# Patient Record
Sex: Male | Born: 1960 | Race: White | Hispanic: No | Marital: Married | Smoking: Never smoker
Health system: Southern US, Community
[De-identification: ages and names within clinical notes are randomized; demographics above are authoritative.]

## PROBLEM LIST (undated history)

## (undated) DIAGNOSIS — N200 Calculus of kidney: Secondary | ICD-10-CM

## (undated) DIAGNOSIS — F329 Major depressive disorder, single episode, unspecified: Secondary | ICD-10-CM

## (undated) DIAGNOSIS — I341 Nonrheumatic mitral (valve) prolapse: Secondary | ICD-10-CM

## (undated) DIAGNOSIS — F32A Depression, unspecified: Secondary | ICD-10-CM

## (undated) DIAGNOSIS — T7840XA Allergy, unspecified, initial encounter: Secondary | ICD-10-CM

## (undated) HISTORY — DX: Allergy, unspecified, initial encounter: T78.40XA

## (undated) HISTORY — DX: Calculus of kidney: N20.0

## (undated) HISTORY — DX: Depression, unspecified: F32.A

## (undated) HISTORY — DX: Nonrheumatic mitral (valve) prolapse: I34.1

## (undated) HISTORY — DX: Other disorders of bilirubin metabolism: E80.6

## (undated) HISTORY — DX: Major depressive disorder, single episode, unspecified: F32.9

## (undated) HISTORY — PX: INTUSSUSCEPTION REPAIR: SHX1847

---

## 1994-06-01 HISTORY — PX: ARTHROSCOPIC REPAIR ACL: SUR80

## 1999-07-30 ENCOUNTER — Ambulatory Visit (HOSPITAL_COMMUNITY): Admission: RE | Admit: 1999-07-30 | Discharge: 1999-07-30 | Payer: Self-pay | Admitting: Gastroenterology

## 1999-07-30 ENCOUNTER — Encounter: Payer: Self-pay | Admitting: Gastroenterology

## 1999-08-19 ENCOUNTER — Ambulatory Visit (HOSPITAL_COMMUNITY): Admission: RE | Admit: 1999-08-19 | Discharge: 1999-08-19 | Payer: Self-pay | Admitting: Gastroenterology

## 1999-08-19 ENCOUNTER — Encounter: Payer: Self-pay | Admitting: Gastroenterology

## 1999-11-07 ENCOUNTER — Encounter: Admission: RE | Admit: 1999-11-07 | Discharge: 1999-11-07 | Payer: Self-pay | Admitting: Family Medicine

## 2003-06-02 HISTORY — PX: COLONOSCOPY: SHX174

## 2003-09-28 ENCOUNTER — Ambulatory Visit (HOSPITAL_COMMUNITY): Admission: RE | Admit: 2003-09-28 | Discharge: 2003-09-28 | Payer: Self-pay | Admitting: Gastroenterology

## 2005-11-30 ENCOUNTER — Ambulatory Visit: Payer: Self-pay | Admitting: Family Medicine

## 2006-08-30 ENCOUNTER — Ambulatory Visit: Payer: Self-pay | Admitting: Family Medicine

## 2006-12-06 ENCOUNTER — Ambulatory Visit: Payer: Self-pay | Admitting: Family Medicine

## 2007-01-06 ENCOUNTER — Ambulatory Visit: Payer: Self-pay | Admitting: Family Medicine

## 2007-08-07 ENCOUNTER — Ambulatory Visit: Payer: Self-pay | Admitting: Family Medicine

## 2007-08-26 ENCOUNTER — Ambulatory Visit: Payer: Self-pay | Admitting: Internal Medicine

## 2007-10-03 ENCOUNTER — Ambulatory Visit: Payer: Self-pay | Admitting: Family Medicine

## 2008-01-17 ENCOUNTER — Ambulatory Visit: Payer: Self-pay | Admitting: Family Medicine

## 2008-09-25 ENCOUNTER — Ambulatory Visit: Payer: Self-pay | Admitting: Family Medicine

## 2008-10-04 ENCOUNTER — Ambulatory Visit: Payer: Self-pay | Admitting: Family Medicine

## 2009-05-29 ENCOUNTER — Ambulatory Visit: Payer: Self-pay | Admitting: Family Medicine

## 2009-06-24 ENCOUNTER — Ambulatory Visit: Payer: Self-pay | Admitting: Family Medicine

## 2009-07-25 ENCOUNTER — Ambulatory Visit: Payer: Self-pay | Admitting: Family Medicine

## 2009-08-12 ENCOUNTER — Ambulatory Visit: Payer: Self-pay | Admitting: Family Medicine

## 2009-08-19 ENCOUNTER — Ambulatory Visit: Payer: Self-pay | Admitting: Family Medicine

## 2009-08-29 ENCOUNTER — Ambulatory Visit: Payer: Self-pay | Admitting: Family Medicine

## 2010-09-24 ENCOUNTER — Encounter (INDEPENDENT_AMBULATORY_CARE_PROVIDER_SITE_OTHER): Payer: 59 | Admitting: Family Medicine

## 2010-09-24 DIAGNOSIS — G479 Sleep disorder, unspecified: Secondary | ICD-10-CM

## 2010-09-24 DIAGNOSIS — J309 Allergic rhinitis, unspecified: Secondary | ICD-10-CM

## 2010-09-24 DIAGNOSIS — Z0000 Encounter for general adult medical examination without abnormal findings: Secondary | ICD-10-CM

## 2010-09-24 DIAGNOSIS — Z79899 Other long term (current) drug therapy: Secondary | ICD-10-CM

## 2010-10-28 ENCOUNTER — Encounter: Payer: Self-pay | Admitting: Medical

## 2010-10-28 ENCOUNTER — Ambulatory Visit (INDEPENDENT_AMBULATORY_CARE_PROVIDER_SITE_OTHER): Payer: 59 | Admitting: Medical

## 2010-10-28 VITALS — BP 110/78 | HR 80 | Temp 98.2°F | Ht 70.0 in | Wt 148.0 lb

## 2010-10-28 DIAGNOSIS — J029 Acute pharyngitis, unspecified: Secondary | ICD-10-CM

## 2010-10-28 LAB — POCT RAPID STREP A (OFFICE): Rapid Strep A Screen: NEGATIVE

## 2010-10-28 NOTE — Progress Notes (Signed)
  Subjective:     Cory [name] is a 50 y.o. male who presents for evaluation of sore throat. Associated symptoms include sweats, low grade fevers, sinus and nasal congestion and sore throat. Onset of symptoms was 4 days ago, and have been unchanged since that time. He is drinking plenty of fluids. Sick contacts include his wife who has been sick for the last 10 days with the flu, she was not on any type of medication. He is using Tylenol for symptoms. No other aggravating or relieving factors. 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  Past Medical History  Diagnosis Date  . MVP (mitral valve prolapse)   . Hyperbilirubinemia   . Renal stone   . Allergy      Review of Systems Constitutional: denies unexpected weight change, anorexia, fatigue Dermatology: denies rash Cardiology: denies chest pain, palpitations Respiratory: Positive for cough the last 2 days, nonproductive. denies shortness of breath, wheezing,  Gastroenterology: Positive for diarrhea, but he notes long history of diarrhea problems. denies abdominal pain, nausea, vomiting Musculoskeletal: denies arthralgias, myalgias, joint swelling, back pain, neck pain Ophthalmology: denies eye redness, itching, discharge    Objective:      Filed Vitals:   10/28/10 0949  BP: 110/78  Pulse: 80  Temp: 98.2 F (36.8 C)    General appearance: no distress, WD/WN, mildly ill-appearing HEENT: normocephalic, conjunctiva/corneas normal, sclerae anicteric, nares patent, clear discharge, mild erythema, pharynx with erythema, no exudate.  Oral cavity: MMM, no lesions  Neck: supple, no lymphadenopathy, no thyromegaly Heart: RRR, normal S1, S2, no murmurs Lungs: CTA bilaterally, no wheezes, rhonchi, or rales Abdomen: +bs, soft, non tender, non distended, no masses, no hepatomegaly, no splenomegaly Musculoskeletal: non tender, no obvious deformity of  extremities  Laboratory Strep test done. Results:negative.    Assessment:    Acute pharyngitis, likely  Viral pharyngitis.     Plan:   Advised that symptoms and exam suggest a viral etiology.  Discussed symptomatic treatment including salt water gargles, warm fluids, rest, hydrate well, can use over-the-counter Tylenol for throat pain, fever, or malaise. If worse or not improving within 2-3 days, call or return.

## 2011-01-19 ENCOUNTER — Encounter: Payer: Self-pay | Admitting: Family Medicine

## 2011-01-19 ENCOUNTER — Ambulatory Visit (INDEPENDENT_AMBULATORY_CARE_PROVIDER_SITE_OTHER): Payer: 59 | Admitting: Family Medicine

## 2011-01-19 VITALS — BP 110/80 | HR 70 | Wt 145.0 lb

## 2011-01-19 DIAGNOSIS — M549 Dorsalgia, unspecified: Secondary | ICD-10-CM

## 2011-01-19 NOTE — Patient Instructions (Signed)
Heat for 20 minutes 3 times per day. Gentle stretching. Aleve 2 pills twice a day. Massage and chiropractic can both be beneficial

## 2011-01-19 NOTE — Progress Notes (Signed)
  Subjective:    Patient ID: Cory [name], male    DOB: [birth year], 50 y.o.   MRN: [id number]  HPI Approximately 4 weeks ago he went to a trampoline part and developed some pain but has no knowledge of any acute injury. He treated this conservatively but has not seen any improvement. It is interfering with his ability to run. He is trying to train for  Review of Systems     Objective:   Physical Exam Good motion of his back. No tenderness to palpation of the paravertebral muscles or the spine. Lungs clear to auscultation.       Assessment & Plan:  Probable musculoskeletal back pain. I discussed options with him including heat, Aleve, gentle stretching as well as massage and chiropractic. If he does not improve he will return for further evaluation. Also discussed need for him to get back into more of a physical activity as he gets better but due to slowly

## 2011-11-19 ENCOUNTER — Telehealth: Payer: Self-pay | Admitting: Internal Medicine

## 2011-11-19 NOTE — Telephone Encounter (Signed)
error 

## 2011-11-24 ENCOUNTER — Encounter: Payer: Self-pay | Admitting: Internal Medicine

## 2011-11-26 ENCOUNTER — Telehealth: Payer: Self-pay | Admitting: Family Medicine

## 2011-11-26 NOTE — Telephone Encounter (Signed)
LM

## 2011-11-30 ENCOUNTER — Ambulatory Visit (INDEPENDENT_AMBULATORY_CARE_PROVIDER_SITE_OTHER): Payer: 59 | Admitting: Family Medicine

## 2011-11-30 ENCOUNTER — Encounter: Payer: Self-pay | Admitting: Family Medicine

## 2011-11-30 VITALS — BP 120/80 | HR 73 | Ht 70.0 in | Wt 152.0 lb

## 2011-11-30 DIAGNOSIS — J302 Other seasonal allergic rhinitis: Secondary | ICD-10-CM

## 2011-11-30 DIAGNOSIS — Z8249 Family history of ischemic heart disease and other diseases of the circulatory system: Secondary | ICD-10-CM

## 2011-11-30 DIAGNOSIS — Z0000 Encounter for general adult medical examination without abnormal findings: Secondary | ICD-10-CM

## 2011-11-30 DIAGNOSIS — Z87442 Personal history of urinary calculi: Secondary | ICD-10-CM | POA: Insufficient documentation

## 2011-11-30 DIAGNOSIS — F341 Dysthymic disorder: Secondary | ICD-10-CM

## 2011-11-30 DIAGNOSIS — N200 Calculus of kidney: Secondary | ICD-10-CM

## 2011-11-30 DIAGNOSIS — J309 Allergic rhinitis, unspecified: Secondary | ICD-10-CM

## 2011-11-30 LAB — POCT URINALYSIS DIPSTICK
Blood, UA: NEGATIVE
Protein, UA: NEGATIVE
Spec Grav, UA: 1.02
Urobilinogen, UA: NEGATIVE
pH, UA: 5

## 2011-11-30 MED ORDER — CITALOPRAM HYDROBROMIDE 20 MG PO TABS: 20.0000 mg | ORAL_TABLET | Freq: Every day | ORAL | Status: DC

## 2011-11-30 MED ORDER — BUPROPION HCL ER (SR) 150 MG PO TB12: 150.0000 mg | ORAL_TABLET | Freq: Two times a day (BID) | ORAL | Status: DC

## 2011-11-30 NOTE — Progress Notes (Signed)
Subjective:    Patient ID: Cory [name], male    DOB: [birth year], 51 y.o.   MRN: [id number]  HPI He is here for complete examination. In general he is doing well. He has had no difficulty from his allergies. He has had no recent renal stones. His GI system is not giving him any trouble. His main concern today is that although he is doing relatively well, psychologically he is not ready would like to be. He has tried stopping the Wellbutrin in the past but started back on it after a month or 2. He found that he became much more irritable and less productive at work when he was off his Wellbutrin. He has been involved in counseling. He is interested in trying another medication although in the past he had been reluctant to try this. He does have various other complaints that are chronic and stable. They're listed on his history form. His work is going fairly well. His home life is stable. Social and family history is unchanged.   Review of Systems Negative except as above    Objective:   Physical Exam BP 120/80  Pulse 73  Ht 5' 10"  (1.778 m)  Wt 152 lb (68.947 kg)  BMI 21.81 kg/m2  SpO2 98%  General Appearance:    Alert, cooperative, no distress, appears stated age  Head:    Normocephalic, without obvious abnormality, atraumatic  Eyes:    PERRL, conjunctiva/corneas clear, EOM's intact, fundi    benign  Ears:    Normal TM's and external ear canals  Nose:   Nares normal, mucosa normal, no drainage or sinus   tenderness  Throat:   Lips, mucosa, and tongue normal; teeth and gums normal  Neck:   Supple, no lymphadenopathy;  thyroid:  no   enlargement/tenderness/nodules; no carotid   bruit or JVD  Back:    Spine nontender, no curvature, ROM normal, no CVA     tenderness  Lungs:     Clear to auscultation bilaterally without wheezes, rales or     ronchi; respirations unlabored  Chest Wall:    No tenderness or deformity   Heart:    Regular rate and rhythm, S1 and S2 normal, no murmur, rub  or gallop  Breast Exam:    No chest wall tenderness, masses or gynecomastia  Abdomen:     Soft, non-tender, nondistended, normoactive bowel sounds,    no masses, no hepatosplenomegaly  Genitalia:    Normal male external genitalia without lesions.  Testicles without masses.  No inguinal hernias.  Rectal:    Normal sphincter tone, no masses or tenderness; guaiac positive stool.  Prostate smooth, no nodules, not enlarged.  Extremities:   No clubbing, cyanosis or edema  Pulses:   2+ and symmetric all extremities  Skin:   Skin color, texture, turgor normal, no rashes or lesions  Lymph nodes:   Cervical, supraclavicular, and axillary nodes normal  Neurologic:   CNII-XII intact, normal strength, sensation and gait; reflexes 2+ and symmetric throughout          Psych:   Normal mood, affect, hygiene and grooming.           Assessment & Plan:   1. Routine general medical examination at a health care facility  POCT Urinalysis Dipstick, Hemoccult - 1 Card (office)  2. Dysthymia  citalopram (CELEXA) 20 MG tablet, buPROPion (WELLBUTRIN SR) 150 MG 12 hr tablet  3. Gilbert's disease    4. Allergic rhinitis, seasonal    5. Renal  stones    6. Family history of ischemic heart disease     his stools have been guaiac positive ever since we have checked this. He did have a colonoscopy several years ago which was clean. He is not interested in pursuing this any further. I will add Celexa to his regimen. Check him again in 6 weeks. Hopefully this will help turn the corner psychologically for him.

## 2012-01-12 ENCOUNTER — Ambulatory Visit (INDEPENDENT_AMBULATORY_CARE_PROVIDER_SITE_OTHER): Payer: 59 | Admitting: Family Medicine

## 2012-01-12 ENCOUNTER — Ambulatory Visit
Admission: RE | Admit: 2012-01-12 | Discharge: 2012-01-12 | Disposition: A | Discharge status: Home / Self Care | Payer: 59 | Source: Ambulatory Visit | Attending: Family Medicine | Admitting: Family Medicine

## 2012-01-12 ENCOUNTER — Encounter: Payer: Self-pay | Admitting: Family Medicine

## 2012-01-12 VITALS — BP 120/76 | HR 89 | Wt 150.0 lb

## 2012-01-12 DIAGNOSIS — M25532 Pain in left wrist: Secondary | ICD-10-CM

## 2012-01-12 DIAGNOSIS — F341 Dysthymic disorder: Secondary | ICD-10-CM

## 2012-01-12 DIAGNOSIS — M25539 Pain in unspecified wrist: Secondary | ICD-10-CM

## 2012-01-12 MED ORDER — BUPROPION HCL ER (SR) 200 MG PO TB12: 200.0000 mg | ORAL_TABLET | Freq: Two times a day (BID) | ORAL | Status: DC

## 2012-01-12 NOTE — Patient Instructions (Signed)
Call me in about a month to let me know how the new dosing is working

## 2012-01-12 NOTE — Progress Notes (Signed)
  Subjective:    Patient ID: Cory [name], male    DOB: 11-22-1960, 51 y.o.   MRN: [id number]  HPI He is here for recheck. He does state that he doesn't feel any better on the Celexa and in fact is having sexual side effects of slightly decreased libido and anorgasmia. He also is noted some nausea. He fell while ice-skating on July 6 injuring his wrist. He continues to complain of pain in the carpal area. He also is having some difficulty with left foot pain near the MTP joint of the first toe. 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   Review of Systems     Objective:   Physical Exam Alert and in no distress with appropriate affect. Exam of the wrist does show good motion with some tenderness palpation over the midportion of the carpals. No swelling is noted. He was sent for x-ray and there is question of triquetrum fracture. Exam shows slight tenderness to palpation over the MTP joint. Tenderness intact.       Assessment & Plan:   1. Dysthymia  buPROPion (WELLBUTRIN SR) 200 MG 12 hr tablet  2. Left wrist pain  Ambulatory referral to Orthopedic Surgery, DG Wrist 2 Views Left   possible left wrist fracture. Refer to orthopedics for this. Recommend changing the mechanism that he is switching gears with his foot and also use padding in this area did take some pressure off. He is to call me in about a month to let me know how he is doing on the higher dose of Wellbutrin.

## 2012-02-09 ENCOUNTER — Ambulatory Visit (INDEPENDENT_AMBULATORY_CARE_PROVIDER_SITE_OTHER): Payer: 59 | Admitting: Family Medicine

## 2012-02-09 ENCOUNTER — Encounter: Payer: Self-pay | Admitting: Family Medicine

## 2012-02-09 VITALS — BP 112/80 | HR 76 | Temp 98.4°F | Resp 16 | Wt 151.0 lb

## 2012-02-09 DIAGNOSIS — M7020 Olecranon bursitis, unspecified elbow: Secondary | ICD-10-CM

## 2012-02-09 DIAGNOSIS — M7021 Olecranon bursitis, right elbow: Secondary | ICD-10-CM

## 2012-02-09 MED ORDER — DOXYCYCLINE HYCLATE 100 MG PO TABS: 100.0000 mg | ORAL_TABLET | Freq: Two times a day (BID) | ORAL | Status: AC

## 2012-02-09 NOTE — Progress Notes (Signed)
  Subjective:    Patient ID: Cory [name], male    DOB: December 15, 1960, 51 y.o.   MRN: [id number]  HPI In July he fell and injured his left wrist and right elbow. The wrist is being monitored at this time. Since the fall in July he noted one episode approximately 3 weeks ago when the right elbow swelled and was tender to palpation for a week or 2. It spontaneously resolved. Yesterday he noted the onset of swelling, warmth and slight tenderness.   Review of Systems     Objective:   Physical Exam Right olecranon bursa is slightly warm, red and tender to palpation. Roughly 5 x 6 cm. No evidence of disruption of the skin       Assessment & Plan:   1. Olecranon bursitis of right elbow  doxycycline (VIBRA-TABS) 100 MG tablet   I explained that I would treat this conservatively since it did  resolve on its own prior to this. If he continues to have difficulty, further surgical evaluation will be considered.

## 2012-02-09 NOTE — Patient Instructions (Signed)
If the swelling and redness gets worse, give me a call

## 2012-02-19 ENCOUNTER — Encounter: Payer: Self-pay | Admitting: Family Medicine

## 2012-02-19 ENCOUNTER — Ambulatory Visit (INDEPENDENT_AMBULATORY_CARE_PROVIDER_SITE_OTHER): Payer: 59 | Admitting: Family Medicine

## 2012-02-19 VITALS — BP 102/64 | HR 60 | Wt 151.0 lb

## 2012-02-19 DIAGNOSIS — M7020 Olecranon bursitis, unspecified elbow: Secondary | ICD-10-CM

## 2012-02-19 DIAGNOSIS — M7021 Olecranon bursitis, right elbow: Secondary | ICD-10-CM

## 2012-02-19 DIAGNOSIS — Z23 Encounter for immunization: Secondary | ICD-10-CM

## 2012-02-19 NOTE — Progress Notes (Signed)
Pt is scheduled with Dr. Bernardo Heater on Friday October 4th @ 2:15pm for bursitis in right elbow. [phone]

## 2012-02-19 NOTE — Progress Notes (Signed)
  Subjective:    Patient ID: Cory [name], male    DOB: Nov 21, 1960, 51 y.o.   MRN: [id number]  HPI He is here for recheck. The swelling has not diminished. He is having some minimal discomfort especially with palpation proximally.  Review of Systems     Objective:   Physical Exam Exam of the right elbow does show diffuse swelling but no tenderness. It is only slightly warmer than the other scan.       Assessment & Plan:   1. Olecranon bursitis of right elbow  Ambulatory referral to Orthopedic Surgery  discussed possible aspiration but feel more comfortable referring to orthopedics for followup on this since he does have a history of, to that area.

## 2012-10-20 ENCOUNTER — Telehealth: Payer: Self-pay | Admitting: Internal Medicine

## 2012-10-20 NOTE — Telephone Encounter (Signed)
Called pt to inform him that his script was ready

## 2012-10-20 NOTE — Telephone Encounter (Signed)
Pt has appt July 7 and pt gets his labs done at [facility] but needs written script of what you would like done. If you could write out what needs to be done. Please call pt when it is ready

## 2012-10-28 DIAGNOSIS — Z0289 Encounter for other administrative examinations: Secondary | ICD-10-CM

## 2012-10-31 ENCOUNTER — Telehealth: Payer: Self-pay | Admitting: Internal Medicine

## 2012-10-31 NOTE — Telephone Encounter (Signed)
Faxed over medical records to [facility] @ [phone]

## 2012-12-05 ENCOUNTER — Ambulatory Visit (INDEPENDENT_AMBULATORY_CARE_PROVIDER_SITE_OTHER): Payer: 59 | Admitting: Family Medicine

## 2012-12-05 ENCOUNTER — Encounter: Payer: Self-pay | Admitting: Family Medicine

## 2012-12-05 VITALS — BP 120/80 | HR 76 | Ht 70.5 in | Wt 149.0 lb

## 2012-12-05 DIAGNOSIS — N200 Calculus of kidney: Secondary | ICD-10-CM

## 2012-12-05 DIAGNOSIS — F341 Dysthymic disorder: Secondary | ICD-10-CM

## 2012-12-05 DIAGNOSIS — Z0000 Encounter for general adult medical examination without abnormal findings: Secondary | ICD-10-CM

## 2012-12-05 DIAGNOSIS — Z8249 Family history of ischemic heart disease and other diseases of the circulatory system: Secondary | ICD-10-CM

## 2012-12-05 DIAGNOSIS — J302 Other seasonal allergic rhinitis: Secondary | ICD-10-CM

## 2012-12-05 DIAGNOSIS — J309 Allergic rhinitis, unspecified: Secondary | ICD-10-CM

## 2012-12-05 MED ORDER — BUPROPION HCL ER (SR) 200 MG PO TB12: 200.0000 mg | ORAL_TABLET | Freq: Two times a day (BID) | ORAL | Status: DC

## 2012-12-05 NOTE — Progress Notes (Signed)
  Subjective:    Patient ID: Cory [name], male    DOB: [birth year], 52 y.o.   MRN: [id number]  HPI He is here for complete examination. He still he started taking Wellbutrin again due to work related stress. He is on 150 twice a day and does note some slight improvement but would like increased to the 200 twice a day dosing. He has not been on that strength yet. His allergies are under good control. He has not had any difficulty with renal stones. He has started an exercise program and is having some slight left great toe discomfort. His son just graduated and will be going to [employer] call. His older daughter is now living at home and going to school locally. His marriage is going well. His work is stressful due to the [place].   Review of Systems Negative except as above    Objective:   Physical Exam BP 120/80  Pulse 76  Ht 5' 10.5" (1.791 m)  Wt 149 lb (67.586 kg)  BMI 21.07 kg/m2  General Appearance:    Alert, cooperative, no distress, appears stated age  Head:    Normocephalic, without obvious abnormality, atraumatic  Eyes:    PERRL, conjunctiva/corneas clear, EOM's intact, fundi    benign  Ears:    Normal TM's and external ear canals  Nose:   Nares normal, mucosa normal, no drainage or sinus   tenderness  Throat:   Lips, mucosa, and tongue normal; teeth and gums normal  Neck:   Supple, no lymphadenopathy;  thyroid:  no   enlargement/tenderness/nodules; no carotid   bruit or JVD  Back:    Spine nontender, no curvature, ROM normal, no CVA     tenderness  Lungs:     Clear to auscultation bilaterally without wheezes, rales or     ronchi; respirations unlabored  Chest Wall:    No tenderness or deformity   Heart:    Regular rate and rhythm, S1 and S2 normal, no murmur, rub   or gallop  Breast Exam:    No chest wall tenderness, masses or gynecomastia  Abdomen:     Soft, non-tender, nondistended, normoactive bowel sounds,    no masses, no hepatosplenomegaly  Genitalia:    Normal male  external genitalia without lesions.  Testicles without masses.  No inguinal hernias.  Rectal:   deferred. Colonoscopy will be scheduled for next year   Extremities:   No clubbing, cyanosis or edema  Pulses:   2+ and symmetric all extremities  Skin:   Skin color, texture, turgor normal, no rashes or lesions  Lymph nodes:   Cervical, supraclavicular, and axillary nodes normal  Neurologic:   CNII-XII intact, normal strength, sensation and gait; reflexes 2+ and symmetric throughout          Psych:   Normal mood, affect, hygiene and grooming.          Assessment & Plan:  Dysthymia - Plan: buPROPion (WELLBUTRIN SR) 200 MG 12 hr tablet  Gilbert's disease  Allergic rhinitis, seasonal  Renal stones  Family history of ischemic heart disease  he will return here in 2 months for recheck on his bupropion.

## 2013-01-19 ENCOUNTER — Other Ambulatory Visit: Payer: Self-pay

## 2013-01-19 DIAGNOSIS — F341 Dysthymic disorder: Secondary | ICD-10-CM

## 2013-01-19 MED ORDER — BUPROPION HCL ER (SR) 200 MG PO TB12: 200.0000 mg | ORAL_TABLET | Freq: Two times a day (BID) | ORAL | Status: DC

## 2013-06-05 ENCOUNTER — Encounter: Payer: Self-pay | Admitting: Family Medicine

## 2013-06-05 ENCOUNTER — Ambulatory Visit (INDEPENDENT_AMBULATORY_CARE_PROVIDER_SITE_OTHER): Payer: 59 | Admitting: Family Medicine

## 2013-06-05 VITALS — BP 106/74 | HR 67 | Wt 145.0 lb

## 2013-06-05 DIAGNOSIS — H9319 Tinnitus, unspecified ear: Secondary | ICD-10-CM

## 2013-06-05 DIAGNOSIS — H9313 Tinnitus, bilateral: Secondary | ICD-10-CM

## 2013-06-05 DIAGNOSIS — M76821 Posterior tibial tendinitis, right leg: Secondary | ICD-10-CM

## 2013-06-05 DIAGNOSIS — M79609 Pain in unspecified limb: Secondary | ICD-10-CM

## 2013-06-05 DIAGNOSIS — M79675 Pain in left toe(s): Secondary | ICD-10-CM

## 2013-06-05 DIAGNOSIS — M76829 Posterior tibial tendinitis, unspecified leg: Secondary | ICD-10-CM

## 2013-06-05 DIAGNOSIS — R42 Dizziness and giddiness: Secondary | ICD-10-CM

## 2013-06-05 NOTE — Progress Notes (Signed)
   Subjective:    Patient ID: Cory [name], male    DOB: 1961-06-01, 53 y.o.   MRN: [id number]  HPI Approximately one week ago he noted ringing in his ear and a tin sound with someone with cough. 4 days ago he had the onset of dizziness. He still feels lightheaded and a ringing sensation in his ear. He also notes decreased hearing. He also complains of continued difficulty with left great toe pain. This has been bothering him for over a year. He is able to continue to run in spite of this. He also has noted some right medial ankle discomfort. This started after he got a  New pair of running shoes  He was not having difficulty with his old pair.  Review of Systems     Objective:   Physical Exam alert and in no distress. Tympanic membranes and canals are normal. Throat is clear. Tonsils are normal. Neck is supple without adenopathy or thyromegaly. Cardiac exam shows a regular sinus rhythm without murmurs or gallops. Lungs are clear to auscultation. Hearing test showed no lateralization of his symptoms. Exam of the right ankle does show tenderness to palpation over the posterior aspect of the medial epicondyle. No crepitus noted. No swelling. Pain on dorsiflexion. Left great toe shows some slight hypertrophy with x-ray evidence of arthritis.      Assessment & Plan:  Ringing in ears, bilateral - Plan: Tympanometry  Great toe pain, left - Plan: DG Toe Great Left  Posterior tibial tendinitis of right leg  Dizziness  I discussed the possibility of his dizziness and ringing in the ears being related to Mnire's disease. Since he is only had one episode, watchful waiting would be appropriate. Discussed followup and possible referral. I recommended that he go back to his old shoes for running and cut back on the running. It seems as if the new shoes actually cause more difficulty than me were supposed to help with. If further difficulty, further referral might be needed. Discussed the toe  arthritis and at this point no major intervention is needed.

## 2013-06-05 NOTE — Patient Instructions (Signed)
Watchful waiting concerning the dizziness the ringing in ears. Go back year old running shoes and back off on Mylanta and if you keep having difficulty let me know

## 2013-11-27 ENCOUNTER — Encounter: Payer: Self-pay | Admitting: Family Medicine

## 2013-11-27 ENCOUNTER — Other Ambulatory Visit: Payer: Self-pay

## 2013-11-27 ENCOUNTER — Ambulatory Visit (INDEPENDENT_AMBULATORY_CARE_PROVIDER_SITE_OTHER): Payer: 59 | Admitting: Family Medicine

## 2013-11-27 VITALS — Wt 149.0 lb

## 2013-11-27 DIAGNOSIS — L259 Unspecified contact dermatitis, unspecified cause: Secondary | ICD-10-CM

## 2013-11-27 MED ORDER — PREDNISONE 10 MG PO KIT: PACK | ORAL | Status: DC

## 2013-11-27 MED ORDER — SODIUM CHLORIDE 0.9 % IV SOLN
125.0000 mg | INTRAVENOUS | Status: AC
  Administered 2013-11-27: 130 mg via INTRAMUSCULAR

## 2013-11-27 NOTE — Progress Notes (Signed)
   Subjective:    Patient ID: Cory [name], male    DOB: Jun 27, 1960, 53 y.o.   MRN: [id number]  HPI A weekend he was doing yard work and was exposed to poison ivy. The symptoms started on Saturday.   Review of Systems     Objective:   Physical Exam Diffuse erythema is noted on the medial aspect of both forearms and scattered erythematous lesions some of which are linear indication her on his abdomen at the underwear line.       Assessment & Plan:  Contact dermatitis - Plan: methylPREDNISolone sodium succinate (SOLU-MEDROL) 130 mg in sodium chloride 0.9 % 50 mL IVPB  I will also call in a Sterapred six-day dose pack

## 2014-03-13 ENCOUNTER — Telehealth: Payer: Self-pay | Admitting: Family Medicine

## 2014-03-13 NOTE — Telephone Encounter (Signed)
Pt scheduled a cpe for nov. He would like to have his labs done prior and at [employer] where his wife works. Please write orders for labs so pt can take to [facility]. Pt also would like a referral to a dermatology. He states he has some places he would like checked out. Pt can be reached at [phone].

## 2014-03-14 NOTE — Telephone Encounter (Signed)
LEFT MESSAGE THAT RX WAS READY AND DR.LALONDE WOULD LIKE FOR HIM TO MAKE APPOINT TO SEE HIM HE COULD MAYBE TAKE CARE OF LESIONS FOR HIM

## 2014-03-14 NOTE — Telephone Encounter (Signed)
I wrote the prescription for blood work but have him come in to see me to evaluate the skin lesions. Explain that I can potentially take care of these here in the office.

## 2014-03-19 ENCOUNTER — Encounter: Payer: Self-pay | Admitting: Family Medicine

## 2014-03-19 ENCOUNTER — Ambulatory Visit: Payer: 59 | Admitting: Family Medicine

## 2014-03-19 ENCOUNTER — Ambulatory Visit (INDEPENDENT_AMBULATORY_CARE_PROVIDER_SITE_OTHER): Payer: 59 | Admitting: Family Medicine

## 2014-03-19 VITALS — BP 112/70 | HR 56 | Wt 150.0 lb

## 2014-03-19 DIAGNOSIS — L989 Disorder of the skin and subcutaneous tissue, unspecified: Secondary | ICD-10-CM

## 2014-03-19 DIAGNOSIS — M79662 Pain in left lower leg: Secondary | ICD-10-CM

## 2014-03-19 NOTE — Progress Notes (Signed)
   Subjective:    Patient ID: Cory [name], male    DOB: [birth year], 53 y.o.   MRN: [id number]  HPI Is here for consult concerning lesions on his nose. He also has a lesion on his eyebrow. He recently finished a half marathon and towards the end of the race did note some left calf discomfort however today he says he is feeling better.   Review of Systems     Objective:   Physical Exam Exam of the right side of the nose does show a questionable lesion in the crease of the nose. He also points to the right side of the nose near the bridge however no true lesion is seen. Exam of the eyebrow shows no lesion of any concern. He does have a healing lesion present on the midportion of the left side of the nose. Exam of the left calf shows no palpable tenderness swelling or discoloration. Achilles tendon is normal.      Assessment & Plan:  Facial skin lesion  Calf pain, left  he will set up an appointment to be reevaluated and to do a complete examination. Dermatology referral will probably be made at that time. I recommended conservative care concerning his calf especially since he is going to be trying to prepare for a marathon. Recommended making sure that he stretched, do interval training and make sure his shoes fit properly.

## 2014-04-18 ENCOUNTER — Encounter: Payer: Self-pay | Admitting: Family Medicine

## 2014-04-18 ENCOUNTER — Ambulatory Visit (INDEPENDENT_AMBULATORY_CARE_PROVIDER_SITE_OTHER): Payer: 59 | Admitting: Family Medicine

## 2014-04-18 VITALS — BP 110/70 | HR 70 | Ht 70.0 in | Wt 148.0 lb

## 2014-04-18 DIAGNOSIS — Z8249 Family history of ischemic heart disease and other diseases of the circulatory system: Secondary | ICD-10-CM

## 2014-04-18 DIAGNOSIS — Z87442 Personal history of urinary calculi: Secondary | ICD-10-CM

## 2014-04-18 DIAGNOSIS — I341 Nonrheumatic mitral (valve) prolapse: Secondary | ICD-10-CM

## 2014-04-18 DIAGNOSIS — F341 Dysthymic disorder: Secondary | ICD-10-CM

## 2014-04-18 DIAGNOSIS — Z0000 Encounter for general adult medical examination without abnormal findings: Secondary | ICD-10-CM

## 2014-04-18 DIAGNOSIS — J302 Other seasonal allergic rhinitis: Secondary | ICD-10-CM

## 2014-04-18 DIAGNOSIS — Z1211 Encounter for screening for malignant neoplasm of colon: Secondary | ICD-10-CM

## 2014-04-18 DIAGNOSIS — Z23 Encounter for immunization: Secondary | ICD-10-CM

## 2014-04-18 LAB — POCT URINALYSIS DIPSTICK
Bilirubin, UA: NEGATIVE
Glucose, UA: NEGATIVE
KETONES UA: NEGATIVE
Leukocytes, UA: NEGATIVE
Nitrite, UA: NEGATIVE
PROTEIN UA: NEGATIVE
RBC UA: NEGATIVE
SPEC GRAV UA: 1.02
UROBILINOGEN UA: NEGATIVE
pH, UA: 6

## 2014-04-18 MED ORDER — CITALOPRAM HYDROBROMIDE 20 MG PO TABS: 20.0000 mg | ORAL_TABLET | Freq: Every day | ORAL | Status: DC

## 2014-04-18 NOTE — Progress Notes (Signed)
Subjective:    Patient ID: Cory [name], male    DOB: May 27, 1961, 53 y.o.   MRN: [id number]  HPI He is here for complete examination. He has started note increased difficulty with anxiety and inability to make a decision. He has had difficulty with this in the past and was on Wellbutrin. He also was involved in counseling which did help but only minimally. He was able to taper off of the Wellbutrin and has done well until recently.he is interested in getting aggressive in treating this. He has no other concerns or complaints. He does have a family history of heart disease. He also has a history of MVP but has not had any chest pain or irregular heart rate. No history of recent difficulty with renal stones. His family and social history was reviewed. His home life is going quite well.   Review of Systems  All other systems reviewed and are negative.      Objective:   Physical Exam BP 110/70 mmHg  Pulse 70  Ht 5' 10"  (1.778 m)  Wt 148 lb (67.132 kg)  BMI 21.24 kg/m2  SpO2 98%  General Appearance:    Alert, cooperative, no distress, appears stated age  Head:    Normocephalic, without obvious abnormality, atraumatic  Eyes:    PERRL, conjunctiva/corneas clear, EOM's intact, fundi    benign  Ears:    Normal TM's and external ear canals  Nose:   Nares normal, mucosa normal, no drainage or sinus   tenderness  Throat:   Lips, mucosa, and tongue normal; teeth and gums normal  Neck:   Supple, no lymphadenopathy;  thyroid:  no   enlargement/tenderness/nodules; no carotid   bruit or JVD  Back:    Spine nontender, no curvature, ROM normal, no CVA     tenderness  Lungs:     Clear to auscultation bilaterally without wheezes, rales or     ronchi; respirations unlabored  Chest Wall:    No tenderness or deformity   Heart:    Regular rate and rhythm, S1 and S2 normal, no murmur, rub   or gallop  Breast Exam:    No chest wall tenderness, masses or gynecomastia  Abdomen:     Soft, non-tender,  nondistended, normoactive bowel sounds,    no masses, no hepatosplenomegaly        Extremities:   No clubbing, cyanosis or edema  Pulses:   2+ and symmetric all extremities  Skin:   Skin color, texture, turgor normal, no rashes or lesions  Lymph nodes:   Cervical, supraclavicular, and axillary nodes normal  Neurologic:   CNII-XII intact, normal strength, sensation and gait; reflexes 2+ and symmetric throughout          Psych:   Normal mood, affect, hygiene and grooming.     Recent blood work did show elevated bilirubin but no other difficulty.     Assessment & Plan:  Allergic rhinitis, seasonal  Family history of ischemic heart disease  Gilbert's disease  History of renal stone  MVP (mitral valve prolapse)  Dysthymia - Plan: citalopram (CELEXA) 20 MG tablet  Special screening for malignant neoplasms, colon - Plan: Ambulatory referral to Gastroenterology  Need for prophylactic vaccination and inoculation against influenza - Plan: Flu Vaccine QUAD 36+ mos PF IM (Fluarix Quad PF)  Routine general medical examination at a health care facility - Plan: POCT Urinalysis Dipstick I discussed counseling as well as medication. I will start him on Celexa and follow-up with him  in one month.defer counseling at this time. He needs referral for colonoscopy as it has been 10 years.

## 2014-04-19 ENCOUNTER — Encounter: Payer: Self-pay | Admitting: Family Medicine

## 2014-05-11 ENCOUNTER — Encounter: Payer: Self-pay | Admitting: Internal Medicine

## 2014-06-05 ENCOUNTER — Ambulatory Visit (INDEPENDENT_AMBULATORY_CARE_PROVIDER_SITE_OTHER): Payer: 59 | Admitting: Family Medicine

## 2014-06-05 ENCOUNTER — Encounter: Payer: Self-pay | Admitting: Family Medicine

## 2014-06-05 VITALS — BP 100/70 | HR 72 | Wt 154.0 lb

## 2014-06-05 DIAGNOSIS — F341 Dysthymic disorder: Secondary | ICD-10-CM | POA: Diagnosis not present

## 2014-06-05 MED ORDER — BUPROPION HCL ER (SR) 150 MG PO TB12
150.0000 mg | ORAL_TABLET | Freq: Every day | ORAL | Status: DC
Start: 2014-06-05 — End: 2014-09-11

## 2014-06-05 MED ORDER — CITALOPRAM HYDROBROMIDE 20 MG PO TABS: 20.0000 mg | ORAL_TABLET | Freq: Every day | ORAL | Status: DC

## 2014-06-05 NOTE — Patient Instructions (Signed)
Get the junk work done early. Do that first thing. Get rid of value judgments on whether you like it or not

## 2014-06-05 NOTE — Progress Notes (Signed)
   Subjective:    Patient ID: Cory [name], male    DOB: [birth year], 54 y.o.   MRN: [id number]  HPI He is here for recheck. He has been on Celexa for approximately one month. He is really not sure whether it has helped or not. He still complains mainly of anhedonia. He tends to procrastinate at work. In the past this has caused difficulties for him. At this time he is not interested in further counseling.   Review of Systems     Objective:   Physical Exam Alert and in no distress with appropriate affect       Assessment & Plan:  Dysthymia - Plan: buPROPion (WELLBUTRIN SR) 150 MG 12 hr tablet, citalopram (CELEXA) 20 MG tablet  I have decided to add Wellbutrin back to his regimen and keep him on Celexa. Also discussed possible counseling. Did recommend that he get the things that he procrastinates the most about done early in the day to get it out of the way. Also suggested that he pay less attention to whether he likes or dislikes something and just gets it done.

## 2014-06-06 ENCOUNTER — Ambulatory Visit (AMBULATORY_SURGERY_CENTER): Payer: Self-pay | Admitting: *Deleted

## 2014-06-06 VITALS — Ht 70.0 in | Wt 156.2 lb

## 2014-06-06 DIAGNOSIS — Z1211 Encounter for screening for malignant neoplasm of colon: Secondary | ICD-10-CM

## 2014-06-06 NOTE — Progress Notes (Signed)
No egg or soy allergy  No anesthesia or intubation problems per pt  No diet medications taken  Registered in EMMI   

## 2014-06-22 ENCOUNTER — Encounter: Payer: 59 | Admitting: Internal Medicine

## 2014-07-02 ENCOUNTER — Encounter: Payer: Self-pay | Admitting: Internal Medicine

## 2014-07-02 ENCOUNTER — Ambulatory Visit (AMBULATORY_SURGERY_CENTER): Payer: 59 | Admitting: Internal Medicine

## 2014-07-02 VITALS — BP 112/63 | HR 58 | Temp 96.8°F | Resp 19 | Ht 70.0 in | Wt 156.0 lb

## 2014-07-02 DIAGNOSIS — K633 Ulcer of intestine: Secondary | ICD-10-CM

## 2014-07-02 DIAGNOSIS — K529 Noninfective gastroenteritis and colitis, unspecified: Secondary | ICD-10-CM

## 2014-07-02 DIAGNOSIS — Z1211 Encounter for screening for malignant neoplasm of colon: Secondary | ICD-10-CM

## 2014-07-02 MED ORDER — SODIUM CHLORIDE 0.9 % IV SOLN: 500.0000 mL | INTRAVENOUS | Status: DC

## 2014-07-02 NOTE — Op Note (Signed)
[facility]  [address] [place], [id number]   COLONOSCOPY PROCEDURE REPORT  PATIENT: Cory [name], Cory [name]  MR#: [id number] BIRTHDATE: February 17, 1961 , 53  yrs. old GENDER: male ENDOSCOPIST: Gatha Mayer, MD, [facility] PROCEDURE DATE:  07/02/2014 PROCEDURE:   Colonoscopy with biopsy First Screening Colonoscopy - Avg.  risk and is 50 yrs.  old or older Yes.  Prior Negative Screening - Now for repeat screening. N/A  History of Adenoma - Now for follow-up colonoscopy & has been > or = to 3 yrs.  N/A  Polyps Removed Today? No.  Polyps Removed Today? No.  Recommend repeat exam, <10 yrs? Polyps Removed Today? No.  Recommend repeat exam, <10 yrs? No. ASA CLASS:   Class II INDICATIONS:average risk for colorectal cancer. MEDICATIONS: Propofol 300 mg IV and Monitored anesthesia care  DESCRIPTION OF PROCEDURE:   After the risks benefits and alternatives of the procedure were thoroughly explained, informed consent was obtained.  The digital rectal exam revealed no abnormalities of the rectum, revealed no prostatic nodules, and revealed the prostate was not enlarged.   The LB [id number]-H190 [id number] endoscope was introduced through the anus and advanced to the surgical anastomosis. No adverse events experienced.   The quality of the prep was good, using MiraLax  The instrument was then slowly withdrawn as the colon was fully examined.      COLON FINDINGS: 1) ileo-colonic anastomosis (side-side) with several small superficial ulcerations seen and biopsies. 2) Intact cecum and normal colon otherwise.  Retroflexed views revealed no abnormalities. The time to cecum=6 minutes 04 seconds.  Withdrawal time=12 minutes 13 seconds.  The scope was withdrawn and the procedure completed. COMPLICATIONS: There were no immediate complications.  ENDOSCOPIC IMPRESSION: 1) ileo-colonic anastomosis (side-side) with several small superficial ulcerations seen and biopsies. 2) Intact cecum and normal colon  otherwise - excellent prep  RECOMMENDATIONS: Repeat colonoscopy 10 years most likley  eSigned:  Gatha Mayer, MD, [facility] 07/02/2014 3:25 PM   cc: Jill Alexanders, MD and The Patient

## 2014-07-02 NOTE — Progress Notes (Signed)
  [place] Anesthesia Post-op Note  Patient: Cory [name]  Procedure(s) Performed: colonoscopy  Patient Location: [facility] - Recovery Area  Anesthesia Type: Deep Sedation/Propofol  Level of Consciousness: awake, oriented and patient cooperative  Airway and Oxygen Therapy: Patient Spontanous Breathing  Post-op Pain: none  Post-op Assessment:  Post-op Vital signs reviewed, Patient's Cardiovascular Status Stable, Respiratory Function Stable, Patent Airway, No signs of Nausea or vomiting and Pain level controlled  Post-op Vital Signs: Reviewed and stable  Complications: No apparent anesthesia complications  Viviane Semidey E 3:20 PM

## 2014-07-02 NOTE — Patient Instructions (Addendum)
No polyps or cancer seen. You do have some ulcers where the small intestine was connected to the large intestine - presumably from the surgery to repair intussusception. The ulcers could be due to naprosyn. I took biopsies and will let you know. Most likely they are inconsequential.  Next routine colonoscopy likely in 10 years - 2026  I appreciate the opportunity to care for you. Gatha Mayer, MD, FACG    YOU HAD AN ENDOSCOPIC PROCEDURE TODAY AT [facility]: Refer to the procedure report that was given to you for any specific questions about what was found during the examination.  If the procedure report does not answer your questions, please call your gastroenterologist to clarify.  If you requested that your care partner not be given the details of your procedure findings, then the procedure report has been included in a sealed envelope for you to review at your convenience later.  YOU SHOULD EXPECT: Some feelings of bloating in the abdomen. Passage of more gas than usual.  Walking can help get rid of the air that was put into your GI tract during the procedure and reduce the bloating. If you had a lower endoscopy (such as a colonoscopy or flexible sigmoidoscopy) you may notice spotting of blood in your stool or on the toilet paper. If you underwent a bowel prep for your procedure, then you may not have a normal bowel movement for a few days.  DIET: Your first meal following the procedure should be a light meal and then it is ok to progress to your normal diet.  A half-sandwich or bowl of soup is an example of a good first meal.  Heavy or fried foods are harder to digest and may make you feel nauseous or bloated.  Likewise meals heavy in dairy and vegetables can cause extra gas to form and this can also increase the bloating.  Drink plenty of fluids but you should avoid alcoholic beverages for 24 hours.  ACTIVITY: Your care partner should take you home directly after  the procedure.  You should plan to take it easy, moving slowly for the rest of the day.  You can resume normal activity the day after the procedure however you should NOT DRIVE or use heavy machinery for 24 hours (because of the sedation medicines used during the test).    SYMPTOMS TO REPORT IMMEDIATELY: A gastroenterologist can be reached at any hour.  During normal business hours, 8:30 AM to 5:00 PM Monday through Friday, call [phone].  After hours and on weekends, please call the GI answering service at [phone] who will take a message and have the physician on call contact you.   Following lower endoscopy (colonoscopy or flexible sigmoidoscopy):  Excessive amounts of blood in the stool  Significant tenderness or worsening of abdominal pains  Swelling of the abdomen that is new, acute  Fever of 100F or higher  FOLLOW UP: If any biopsies were taken you will be contacted by phone or by letter within the next 1-3 weeks.  Call your gastroenterologist if you have not heard about the biopsies in 3 weeks.  Our staff will call the home number listed on your records the next business day following your procedure to check on you and address any questions or concerns that you may have at that time regarding the information given to you following your procedure. This is a courtesy call and so if there is no answer at the home number and  we have not heard from you through the emergency physician on call, we will assume that you have returned to your regular daily activities without incident.  SIGNATURES/CONFIDENTIALITY: You and/or your care partner have signed paperwork which will be entered into your electronic medical record.  These signatures attest to the fact that that the information above on your After Visit Summary has been reviewed and is understood.  Full responsibility of the confidentiality of this discharge information lies with you and/or your care-partner.  Continue your normal  medications  You might note some irritation in your nose or some drainage.  This may cause feels of congestion.  This is from the oxygen, which can be irritating.  There is no need for concern, this should clear up in a day or so

## 2014-07-02 NOTE — Progress Notes (Signed)
Called to room to assist during endoscopic procedure.  Patient ID and intended procedure confirmed with present staff. Received instructions for my participation in the procedure from the performing physician.  

## 2014-07-03 ENCOUNTER — Telehealth: Payer: Self-pay | Admitting: *Deleted

## 2014-07-03 NOTE — Telephone Encounter (Signed)
  Follow up Call-  Call back number 07/02/2014  Post procedure Call Back phone  # [phone]  Permission to leave phone message Yes     Patient questions:  Do you have a fever, pain , or abdominal swelling? No. Pain Score  0 *  Have you tolerated food without any problems? Yes.    Have you been able to return to your normal activities? Yes.    Do you have any questions about your discharge instructions: Diet   No. Medications  No. Follow up visit  No.  Do you have questions or concerns about your Care? No.  Actions: * If pain score is 4 or above: No action needed, pain <4.

## 2014-07-10 ENCOUNTER — Encounter: Payer: Self-pay | Admitting: Internal Medicine

## 2014-07-10 ENCOUNTER — Encounter: Payer: Self-pay | Admitting: Family Medicine

## 2014-07-10 ENCOUNTER — Ambulatory Visit (INDEPENDENT_AMBULATORY_CARE_PROVIDER_SITE_OTHER): Payer: 59 | Admitting: Family Medicine

## 2014-07-10 VITALS — BP 120/78 | HR 76 | Wt 152.0 lb

## 2014-07-10 DIAGNOSIS — F341 Dysthymic disorder: Secondary | ICD-10-CM

## 2014-07-10 MED ORDER — CITALOPRAM HYDROBROMIDE 40 MG PO TABS: 40.0000 mg | ORAL_TABLET | Freq: Every day | ORAL | Status: DC

## 2014-07-10 NOTE — Progress Notes (Signed)
   Subjective:    Patient ID: Cory [name], male    DOB: 12/27/1960, 54 y.o.   MRN: [id number]  HPI He is here for recheck. He is now on Celexa as well as Wellbutrin. He cannot really tell any difference since adding the Celexa to his regimen.   Review of Systems     Objective:   Physical Exam Alert and in no distress with appropriate affect       Assessment & Plan:  Dysthymia - Plan: citalopram (CELEXA) 40 MG tablet  he will continue on Wellbutrin and return here in one month for follow-up visit.

## 2014-07-10 NOTE — Progress Notes (Signed)
Quick Note:  Office call  Biopsies are suggesting possible inflammatory bowel disease (could be Crohn's)  Can be hard to tell  Not an emergency but we should discuss in the office at a visit  [place]  Repeat colonoscopy 10 years - no letter ______

## 2014-07-28 ENCOUNTER — Other Ambulatory Visit: Payer: Self-pay | Admitting: Family Medicine

## 2014-07-30 ENCOUNTER — Telehealth: Payer: Self-pay | Admitting: Family Medicine

## 2014-07-30 DIAGNOSIS — F341 Dysthymic disorder: Secondary | ICD-10-CM

## 2014-07-30 MED ORDER — CITALOPRAM HYDROBROMIDE 40 MG PO TABS: 40.0000 mg | ORAL_TABLET | Freq: Every day | ORAL | Status: DC

## 2014-07-30 NOTE — Telephone Encounter (Signed)
Fax request from [employer] (fax [phone])  Citalopram

## 2014-07-30 NOTE — Telephone Encounter (Signed)
Is this okay pt has appointment 07/2014

## 2014-08-14 ENCOUNTER — Ambulatory Visit (INDEPENDENT_AMBULATORY_CARE_PROVIDER_SITE_OTHER): Payer: 59 | Admitting: Family Medicine

## 2014-08-14 ENCOUNTER — Encounter: Payer: Self-pay | Admitting: Family Medicine

## 2014-08-14 VITALS — BP 120/80 | HR 76 | Wt 153.0 lb

## 2014-08-14 DIAGNOSIS — F341 Dysthymic disorder: Secondary | ICD-10-CM

## 2014-08-14 NOTE — Patient Instructions (Signed)
Decrease the Celexa to 20 mg for one week and then stop it . Stop Wellbutrin(181m) one week later.Then start the new medication

## 2014-08-14 NOTE — Progress Notes (Signed)
   Subjective:    Patient ID: Cory [name], male    DOB: 1960/11/28, 54 y.o.   MRN: [id number]  HPI He is here for recheck. He is now taking 40 mg of Celexa as well as 150 of Wellbutrin. He cannot really see much improvement and in fact says that has interfered with his libido.   Review of Systems     Objective:   Physical Exam  He is potentially interested in trying a different medication.Alert and in no distress with appropriate affect and actually looks a little more positive.       Assessment & Plan:  Dysthymia I will have him taper off Celexa going from 40 to 20 and then stopping and he will also then stop the Wellbutrin and then start on Viibryd. Recheck here in 6 weeks. Discussed possible side effects of the new medication. Samples were given.

## 2014-09-11 ENCOUNTER — Encounter: Payer: Self-pay | Admitting: Internal Medicine

## 2014-09-11 ENCOUNTER — Ambulatory Visit (INDEPENDENT_AMBULATORY_CARE_PROVIDER_SITE_OTHER): Payer: 59 | Admitting: Internal Medicine

## 2014-09-11 VITALS — BP 110/70 | HR 72 | Ht 70.0 in | Wt 159.6 lb

## 2014-09-11 DIAGNOSIS — K633 Ulcer of intestine: Secondary | ICD-10-CM

## 2014-09-11 NOTE — Patient Instructions (Signed)
   Please let me know if your digestive symptoms worsen and you need help. Otherwise see you in 2026 for another colonoscopy.  I appreciate the opportunity to care for you. Gatha Mayer, MD, [facility]

## 2014-09-13 ENCOUNTER — Encounter: Payer: Self-pay | Admitting: Internal Medicine

## 2014-09-13 DIAGNOSIS — K633 Ulcer of intestine: Secondary | ICD-10-CM | POA: Insufficient documentation

## 2014-09-13 NOTE — Progress Notes (Signed)
   Subjective:    Patient ID: Cory [name], male    DOB: 29-Oct-1960, 54 y.o.   MRN: [id number] Cc: discuss ulcers seen at colonoscopy HPI This man is here at my request to review findings of ulcerations at ileo-colonic anastomosis and in ileum seen at recent screening colonoscopy. He has an ileo-colonic anastomosis and an intact cecum with IC valve - having had surgery for intussusception at age 54 months.  Pathology results said ulcers showed some changes consistent with inflammatory bowel disease.  He does have chronic loose stools but says he manages with these and quality of life not disrupted No abdominal pains to speak of and no bleeding.  CBC 04/2014 NL CMET - isolated mild hyperbilirubinemia 2.3. - Gilbert's  Medications, allergies, past medical history, past surgical history, family history and social history are reviewed and updated in the EMR.  Review of Systems Well otherwise    Objective:   Physical Exam BP 110/70 mmHg  Pulse 72  Ht 5' 10"  (1.778 m)  Wt 159 lb 9.6 oz (72.394 kg)  BMI 22.90 kg/m2 WDWN WM NAD     Assessment & Plan:  Intestinal ulceration  Cause of this unclear - I told him we see these at small bowel - colonic anastomosis not infrequently. Naproxen on list but rarely uses so NSAID less likely as cause.  IBD is possible but there are no bad sxs.  Loose stools could be from the surgery for intussusception - ? If he has some form of side-side diversion since IC valve intact.   He understands he could have Crohn's disease but says loose stools have been present x decades and he tolerates this and feels ok o/w and does not want any further investigation or Tx to see if sxs change. He will let me know if problems worsen and knows we could do serologic, endoscopic, imaging evaluation if needed.   15 minutes time spent with patient > half in counseling coordination of care

## 2014-09-26 ENCOUNTER — Ambulatory Visit: Payer: 59 | Admitting: Family Medicine

## 2014-10-16 ENCOUNTER — Telehealth: Payer: Self-pay | Admitting: Family Medicine

## 2014-10-16 MED ORDER — VILAZODONE HCL 40 MG PO TABS: 40.0000 mg | ORAL_TABLET | Freq: Every day | ORAL | Status: DC

## 2014-10-16 NOTE — Telephone Encounter (Signed)
Pt has ran out of samples of Viibryd 40MG. Requesting a script be sent to [facility] [address] in [place]. Pt has appt on Thurday 5/19 however he is out of meds now

## 2014-10-18 ENCOUNTER — Encounter: Payer: Self-pay | Admitting: Family Medicine

## 2014-10-18 ENCOUNTER — Ambulatory Visit (INDEPENDENT_AMBULATORY_CARE_PROVIDER_SITE_OTHER): Payer: 59 | Admitting: Family Medicine

## 2014-10-18 VITALS — BP 110/80 | HR 67 | Wt 163.0 lb

## 2014-10-18 DIAGNOSIS — F341 Dysthymic disorder: Secondary | ICD-10-CM | POA: Diagnosis not present

## 2014-10-18 NOTE — Progress Notes (Signed)
   Subjective:    Patient ID: Cory [name], male    DOB: 01-25-1961, 54 y.o.   MRN: [id number]  HPI He is here for recheck. He states that he is roughly 60% better but has noted a decrease in libido. He also says that he has gained weight but states it started before he was placed on this medication. He does note better focus but still not where he would like to be. He has not gotten involved in counseling.   Review of Systems     Objective:   Physical Exam Alert and in no distress with appropriate affect       Assessment & Plan:  Dysthymia He will continue on his present 40 mg dosing for another month and then call me. If he has made very little progress, I will refer to psychiatry to help with medication management. Encouraged him to get involved in counseling. I explained that counseling and medication is statistically the best

## 2014-10-18 NOTE — Patient Instructions (Signed)
Call me in a month and let me know how you're doing. If no improvement I will refer you to psychiatry.

## 2015-11-28 ENCOUNTER — Telehealth: Payer: Self-pay

## 2015-11-28 NOTE — Telephone Encounter (Signed)
Dr.Lalonde if you will let me know what you want to have done I will get the script

## 2015-11-28 NOTE — Telephone Encounter (Signed)
Cbc,cmet ,lipids, hep c

## 2015-11-28 NOTE — Telephone Encounter (Signed)
Pt scheduled CPE for 12/04/2015 and would like to get script for lab work, the routine and to include Hep C please. Pt states he has to go to [facility]. Pt can be reached at [phone].

## 2015-12-02 NOTE — Telephone Encounter (Signed)
Script is up front for [facility] have left message for pt

## 2015-12-04 ENCOUNTER — Ambulatory Visit (INDEPENDENT_AMBULATORY_CARE_PROVIDER_SITE_OTHER): Payer: 59 | Admitting: Family Medicine

## 2015-12-04 ENCOUNTER — Encounter: Payer: Self-pay | Admitting: Family Medicine

## 2015-12-04 VITALS — BP 112/62 | HR 71 | Ht 70.0 in | Wt 156.6 lb

## 2015-12-04 DIAGNOSIS — J302 Other seasonal allergic rhinitis: Secondary | ICD-10-CM

## 2015-12-04 DIAGNOSIS — K633 Ulcer of intestine: Secondary | ICD-10-CM | POA: Diagnosis not present

## 2015-12-04 DIAGNOSIS — F341 Dysthymic disorder: Secondary | ICD-10-CM | POA: Diagnosis not present

## 2015-12-04 DIAGNOSIS — Z0000 Encounter for general adult medical examination without abnormal findings: Secondary | ICD-10-CM | POA: Diagnosis not present

## 2015-12-04 DIAGNOSIS — M25512 Pain in left shoulder: Secondary | ICD-10-CM

## 2015-12-04 DIAGNOSIS — M791 Myalgia: Secondary | ICD-10-CM | POA: Diagnosis not present

## 2015-12-04 DIAGNOSIS — Z87442 Personal history of urinary calculi: Secondary | ICD-10-CM

## 2015-12-04 DIAGNOSIS — Z8249 Family history of ischemic heart disease and other diseases of the circulatory system: Secondary | ICD-10-CM | POA: Diagnosis not present

## 2015-12-04 DIAGNOSIS — M719 Bursopathy, unspecified: Secondary | ICD-10-CM | POA: Diagnosis not present

## 2015-12-04 DIAGNOSIS — M25461 Effusion, right knee: Secondary | ICD-10-CM

## 2015-12-04 DIAGNOSIS — L819 Disorder of pigmentation, unspecified: Secondary | ICD-10-CM

## 2015-12-04 DIAGNOSIS — M25511 Pain in right shoulder: Secondary | ICD-10-CM

## 2015-12-04 DIAGNOSIS — M7918 Myalgia, other site: Secondary | ICD-10-CM

## 2015-12-04 NOTE — Progress Notes (Signed)
Subjective:    Patient ID: Cory [name], male    DOB: 1960-12-12, 55 y.o.   MRN: [id number]  HPI He is here for complete examination. He does have underlying dysthymia and presently is on no medication. He stopped taking his previous medicine about 9 months ago. He seems to be holding his own but does admit that he is not doing as well as he would like. He is not interested in further counseling or medication adjustment at this time. He does have an underlying history of Gilbert's disease. He also has a previous history of intussusception however recently he was diagnosed with intestinal ulceration however the definite diagnosis of Crohn's could not be made. At the present time he is having no abdominal pain, nausea, vomiting area does have a family history of heart disease. His allergies are not giving him any difficulty. He does complain of injury to his right knee approximately 3 or 4 weeks ago while he was working out. He is not sure exactly how he injured it but did feel a acute onset of pain followed after that by an effusion. This has interfered with his running. He states a running actually makes the swelling gets worse. Has been no popping, locking or grinding but just weakness. He does have a history of previous anterior cruciate ligament surgery to that side. Also he has noted since running difficulty in the right gluteal area. This does interfere with his running. Been going on for approximately the last 7 months. He also complains of bilateral shoulder pain and he sleeps. When he lies on either side he states the pain does tend to wake him up. He has been doing some weight lifting but not to any great extent. Been no numbness tingling or weakness. He also is concerned about skin cancer and would like checked.His work and home life are going well. Family and social history as well as health maintenance and immunizations were reviewed. Review of Systems     Objective:   Physical Exam BP  112/62 mmHg  Pulse 71  Ht 5' 10"  (1.778 m)  Wt 156 lb 9.6 oz (71.033 kg)  BMI 22.47 kg/m2  SpO2 98%  General Appearance:    Alert, cooperative, no distress, appears stated age  Head:    Normocephalic, without obvious abnormality, atraumatic  Eyes:    PERRL, conjunctiva/corneas clear, EOM's intact, fundi    benign  Ears:    Normal TM's and external ear canals  Nose:   Nares normal, mucosa normal, no drainage or sinus   tenderness  Throat:   Lips, mucosa, and tongue normal; teeth and gums normal  Neck:   Supple, no lymphadenopathy;  thyroid:  no   enlargement/tenderness/nodules; no carotid   bruit or JVD  Back:    Spine nontender, no curvature, ROM normal, no CVA     tenderness  Lungs:     Clear to auscultation bilaterally without wheezes, rales or     ronchi; respirations unlabored  Chest Wall:    No tenderness or deformity   Heart:    Regular rate and rhythm, S1 and S2 normal, no murmur, rub   or gallop     Abdomen:     Soft, non-tender, nondistended, normoactive bowel sounds,    no masses, no hepatosplenomegaly  Genitalia:    Normal male external genitalia without lesions.  Testicles without masses.  No inguinal hernias.     Extremities:   No clubbing, cyanosis or edema.Bilateral shoulder exam shows full  motion. No laxity noted. Negative sulcus sign. Negative drop arm. Near's and Hawkins test negative. Exam of the right gluteal area does show some slight tenderness in the area of the ischial tuberosity. SI joint normal. Good hip motion. Knee exam does show an effusion. No tenderness to palpation over the joint line or over the patellar tendon. Anterior drawer was equal bilaterally. McMurray's testing negative.   Pulses:   2+ and symmetric all extremities  Skin:   Skin color, texture, turgor normal, A pigmented lesion is noted in the mid back area approximately 1-1-1/2 cm with an irregular border.   Lymph nodes:   Cervical, supraclavicular, and axillary nodes normal  Neurologic:    CNII-XII intact, normal strength, sensation and gait; reflexes 2+ and symmetric throughout          Psych:   Normal mood, affect, hygiene and grooming.          Assessment & Plan:  Routine general medical examination at a health care facility  Knee effusion, right - Plan: DG Knee Complete 4 Views Right, Ambulatory referral to Orthopedic Surgery  Bursitis  Dysthymia  Gilbert's disease  Allergic rhinitis, seasonal  History of renal stone  Family history of ischemic heart disease  Intestinal ulceration  Right buttock pain  Bilateral shoulder pain  Pigmented skin lesion He continues have difficulty with the effusion. Would like to eventually set him up to see Dr. Theda Sers again since he has had a previous anterior cruciate ligament. I'll also set him up to see Dr. Vidal Schwalbe as I think the gluteal lesion is probably a bursitis or possibly tendinopathy. Can also have them look at the shoulder to see if there is any joint arthropathy. The nighttime pain is problematic but he might just need to have a good rehabilitation program. He is to return for a biopsy of the pigmented lesion. He will continue on his other medications.

## 2015-12-11 ENCOUNTER — Encounter: Payer: Self-pay | Admitting: Family Medicine

## 2016-02-23 ENCOUNTER — Other Ambulatory Visit: Payer: Self-pay | Admitting: Family Medicine

## 2016-02-24 NOTE — Telephone Encounter (Signed)
Is this okay to refill? 

## 2016-02-24 NOTE — Telephone Encounter (Signed)
It's okay to refill but my note says he supposed to come back for a biopsy so see what's going on with that

## 2016-03-10 ENCOUNTER — Encounter: Payer: Self-pay | Admitting: Family Medicine

## 2016-03-10 ENCOUNTER — Ambulatory Visit (INDEPENDENT_AMBULATORY_CARE_PROVIDER_SITE_OTHER): Payer: 59 | Admitting: Family Medicine

## 2016-03-10 VITALS — BP 120/78 | HR 60 | Wt 161.0 lb

## 2016-03-10 DIAGNOSIS — R252 Cramp and spasm: Secondary | ICD-10-CM

## 2016-03-10 DIAGNOSIS — Z23 Encounter for immunization: Secondary | ICD-10-CM

## 2016-03-10 NOTE — Patient Instructions (Signed)
Heat for 20 minutes 3 times per day. Gentle stretching. Proper back positioning. Call me if he need furth` er help

## 2016-03-10 NOTE — Progress Notes (Signed)
   Subjective:    Patient ID: Cory [name], male    DOB: 1960/06/03, 55 y.o.   MRN: [id number]  HPI He is here for evaluation of back pain. He woke up 2 days ago with mid back pain. Prior to that he did do hunting sitting in a deer stand for extended period of time. He noted the pain after that. He had difficulty with stretching his back but no numbness, tingling or weakness. He has been taking Aleve 2 pills twice per day.   Review of Systems     Objective:   Physical Exam Alert and complaining of mid back pain. He does have palpable tenderness to the left upper lumbar paravertebral muscles with pain on motion of his back. No tenderness over low back or SI joint.       Assessment & Plan:  Need for prophylactic vaccination and inoculation against influenza - Plan: Flu Vaccine QUAD 36+ mos PF IM (Fluarix & Fluzone Quad PF)  Muscle cramps Commended heat, gentle stretching, continue Aleve. He will call if he would like a muscle relaxer. Will also give a flu shot.

## 2016-03-13 ENCOUNTER — Telehealth: Payer: Self-pay | Admitting: Family Medicine

## 2016-03-13 NOTE — Telephone Encounter (Signed)
Make the referral

## 2016-03-13 NOTE — Telephone Encounter (Signed)
Pt called and is needing a referral to [facility] in [place], pt went this morning to be seen but he needs a referral, states he is still having the back pain and you told him you was going to send him to pt, pt can be reached at [phone] and there number [phone] and he would like this done as soon as possible,

## 2016-03-13 NOTE — Telephone Encounter (Signed)
Faxed over RX to [phone]

## 2017-04-28 ENCOUNTER — Ambulatory Visit: Payer: 59 | Admitting: Family Medicine

## 2017-04-28 ENCOUNTER — Encounter: Payer: Self-pay | Admitting: Family Medicine

## 2017-04-28 VITALS — BP 120/70 | HR 73 | Resp 16 | Ht 69.5 in | Wt 159.4 lb

## 2017-04-28 DIAGNOSIS — R194 Change in bowel habit: Secondary | ICD-10-CM

## 2017-04-28 DIAGNOSIS — F341 Dysthymic disorder: Secondary | ICD-10-CM

## 2017-04-28 DIAGNOSIS — Z8249 Family history of ischemic heart disease and other diseases of the circulatory system: Secondary | ICD-10-CM

## 2017-04-28 DIAGNOSIS — Z23 Encounter for immunization: Secondary | ICD-10-CM

## 2017-04-28 DIAGNOSIS — Z0000 Encounter for general adult medical examination without abnormal findings: Secondary | ICD-10-CM

## 2017-04-28 DIAGNOSIS — J301 Allergic rhinitis due to pollen: Secondary | ICD-10-CM

## 2017-04-28 DIAGNOSIS — Z87442 Personal history of urinary calculi: Secondary | ICD-10-CM

## 2017-04-28 LAB — POCT URINALYSIS DIP (PROADVANTAGE DEVICE)
BILIRUBIN UA: NEGATIVE mg/dL
Bilirubin, UA: NEGATIVE
GLUCOSE UA: NEGATIVE mg/dL
Nitrite, UA: NEGATIVE
PROTEIN UA: NEGATIVE mg/dL
RBC UA: NEGATIVE
SPECIFIC GRAVITY, URINE: 1.03
UUROB: NEGATIVE
pH, UA: 6 (ref 5.0–8.0)

## 2017-04-28 MED ORDER — BUPROPION HCL ER (SR) 200 MG PO TB12: 200.0000 mg | ORAL_TABLET | Freq: Two times a day (BID) | ORAL | 1 refills | Status: DC

## 2017-04-28 NOTE — Progress Notes (Signed)
Subjective:    Patient ID: Cory [name], male    DOB: 1960/09/27, 56 y.o.   MRN: [id number]  HPI He is here for complete examination. His main complaint today is difficulty over the last week with a feeling of fullness in his midabdominal area. He normally has 2 or 3 relatively loose bowel movements per day and now is having usually less than 1 this is more formed than normal. He does not have abdominal pain, nausea or vomiting. Has seen no blood in his stool. Does have a previous history of intussusception and ulcerations present at the anastomosis of the ileo colonic area. He continues on Wellbutrin and recently did cut back to one per day. He has done well on this medication. His exercise pattern has been irregular however he has been able to maintain his weight. Work and home life are going well. His children are all now grown and out of the house. His allergies are under good control. 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      Objective:   Physical Exam BP 120/70   Pulse 73   Resp 16   Ht 5' 9.5" (1.765 m)   Wt 159 lb 6.4 oz (72.3 kg)   SpO2 97%   BMI 23.20 kg/m   General Appearance:    Alert, cooperative, no distress, appears stated age  Head:    Normocephalic, without obvious abnormality, atraumatic  Eyes:    PERRL, conjunctiva/corneas clear, EOM's intact, fundi    benign  Ears:    Normal TM's and external ear canals  Nose:   Nares normal, mucosa normal, no drainage or sinus   tenderness  Throat:   Lips, mucosa, and tongue normal; teeth and gums normal  Neck:   Supple, no lymphadenopathy;  thyroid:  no   enlargement/tenderness/nodules; no carotid   bruit or JVD     Lungs:     Clear to auscultation bilaterally without wheezes, rales or     ronchi; respirations unlabored      Heart:    Regular rate  and rhythm, S1 and S2 normal, no murmur, rub   or gallop     Abdomen:     Soft, non-tender, nondistended, normoactive bowel sounds,    no masses, no hepatosplenomegaly  Genitalia:    Normal male external genitalia without lesions.  Testicles without masses.  No inguinal hernias.  Rectal:    Deferred. In the past his stool guaiacs a voice been positive.   Extremities:   No clubbing, cyanosis or edema  Pulses:   2+ and symmetric all extremities  Skin:   Skin color, texture, turgor normal, no rashes or lesions  Lymph nodes:   Cervical, supraclavicular, and axillary nodes normal  Neurologic:   CNII-XII intact, normal strength, sensation and gait; reflexes 2+ and symmetric throughout          Psych:   Normal mood, affect, hygiene and grooming.           Assessment & Plan:  Routine general medical examination at a health care facility - Plan: POCT Urinalysis DIP (Proadvantage Device)  Need for influenza vaccination - Plan: Flu Vaccine QUAD 6+ mos PF IM (Fluarix Quad PF)  Dysthymia  History of renal stone  Seasonal allergic rhinitis due to pollen  Gilbert's disease  Family history of ischemic  heart disease  Change in bowel habits - Plan: Ambulatory referral to Gastroenterology Prescription written for him to get blood drawn through his wife who works for [employer]. Since he has had change in his bowel habits in a previous history of ulcerations, I think it's prudent to have him reevaluated by GI.

## 2017-04-29 ENCOUNTER — Telehealth: Payer: Self-pay

## 2017-04-29 NOTE — Telephone Encounter (Signed)
Script given with new orders. Pt will come by the office to pick up.

## 2017-04-29 NOTE — Telephone Encounter (Signed)
Pt called asking if you can add on a PSA to his labs?   He's requesting new script for this. Victorino December

## 2017-04-29 NOTE — Telephone Encounter (Signed)
Ok will probably need to send a prescription over for that.

## 2017-06-07 ENCOUNTER — Encounter: Payer: Self-pay | Admitting: Family Medicine

## 2017-06-25 ENCOUNTER — Ambulatory Visit: Payer: 59 | Admitting: Internal Medicine

## 2017-07-01 ENCOUNTER — Encounter: Payer: Self-pay | Admitting: Internal Medicine

## 2017-07-01 ENCOUNTER — Ambulatory Visit: Payer: Managed Care, Other (non HMO) | Admitting: Internal Medicine

## 2017-07-01 ENCOUNTER — Encounter (INDEPENDENT_AMBULATORY_CARE_PROVIDER_SITE_OTHER): Payer: Self-pay

## 2017-07-01 VITALS — BP 90/76 | HR 64 | Ht 69.5 in | Wt 162.1 lb

## 2017-07-01 DIAGNOSIS — R194 Change in bowel habit: Secondary | ICD-10-CM | POA: Diagnosis not present

## 2017-07-01 DIAGNOSIS — K633 Ulcer of intestine: Secondary | ICD-10-CM | POA: Diagnosis not present

## 2017-07-01 DIAGNOSIS — K5000 Crohn's disease of small intestine without complications: Secondary | ICD-10-CM

## 2017-07-01 DIAGNOSIS — Z9049 Acquired absence of other specified parts of digestive tract: Secondary | ICD-10-CM | POA: Diagnosis not present

## 2017-07-01 NOTE — Progress Notes (Signed)
   Cory [name] 57 y.o. [birth year] [id number]  Assessment & Plan:   Encounter Diagnoses  Name Primary?  . Change in bowel habit Yes  . Intestinal ulceration   . Crohn's disease of small intestine without complication ([place]) ?   Marland Kitchen S/P small bowel resection     I have wondered if he has Crohn's disease, he may be and this could be a manifestation of it.  They could have caused stricture/stenosis.  Plan for a CT enterography to evaluate.  Hold colonoscopy in reserve at this time.  Recent CBC and chemistries in early January were normal.  No other worrisome features here.  The other possibility could be some sort of adhesion-like problem though his surgery was many years ago and he has not had problems it could be. I appreciate the opportunity to care for this patient. CC: [name], MD   Subjective:   Chief Complaint: Change in bowel habits  HPI The patient is here for follow-up, he has a history of intussusception and bowel resection and at his last colonoscopy I found neoterminal ileum ulceration that I thought might be Crohn's disease, that was in 2016.  At that time he elected to observe and not try any treatment as he was feeling well.  However over the past few months he has noted that he is not having his typical defecation pattern, he used to eat and within an hour or 2 half to defecate stool somewhat loose at times.  That was stable but then he has begun to notice that it will take hours longer as much as 12 or 24 hours and he will have borborygmi and kind of feel the food passing through his right lower quadrant and have some discomfort and not defecate as often.  He has had minimal abdominal distention perhaps, no nausea or vomiting no fevers no bleeding.  He modified his diet when this started and has reduced roughage and eating more of a bland diet and more liquids. Allergies  Allergen Reactions  . Penicillins     "bloats"   Current Meds  Medication Sig  .  omeprazole (PRILOSEC OTC) 20 MG tablet Take 20 mg by mouth as needed. Using about 3 days a week.   Past Medical History:  Diagnosis Date  . Allergy   . Depression   . Hyperbilirubinemia   . MVP (mitral valve prolapse)   . Renal stone    Past Surgical History:  Procedure Laterality Date  . ARTHROSCOPIC REPAIR ACL  1996  . COLONOSCOPY  2005   negative  . 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   Review of Systems As per HPI  Objective:   Physical Exam @BP  90/76   Pulse 64   Ht 5' 9.5" (1.765 m)   Wt 162 lb 2 oz (73.5 kg)   BMI 23.60 kg/m @  General:  NAD Eyes:   anicteric Lungs:  clear Heart::  S1S2 no rubs, murmurs or gallops Abdomen:  soft and nontender, BS+, RLQ scar     Data Reviewed:  See HPI

## 2017-07-01 NOTE — Patient Instructions (Signed)
You have been scheduled for a CT scan of the abdomen and pelvis at [facility] ([address]---this is in the same building as [facility] [employer]).   You are scheduled on 07/12/17 at 3:30pm. You should arrive at 2:00pm prior to your appointment time for registration and to drink the prep. Please follow the written instructions below on the day of your exam:  WARNING: IF YOU ARE ALLERGIC TO IODINE/X-RAY DYE, PLEASE NOTIFY RADIOLOGY IMMEDIATELY AT [phone]! YOU WILL BE GIVEN A 13 HOUR PREMEDICATION PREP.   Do not eat anything after 11:30AM (4 hours prior to your test)  This test typically takes 30-45 minutes to complete.  If you have any questions regarding your exam or if you need to reschedule, you may call the CT department at [phone] between the hours of 8:00 am and 5:00 pm, Monday-Friday.  ________________________________________________________________________  I appreciate the opportunity to care for you. Silvano Rusk, MD, [facility]

## 2017-07-08 ENCOUNTER — Encounter: Payer: 59 | Admitting: Family Medicine

## 2017-07-12 ENCOUNTER — Ambulatory Visit (INDEPENDENT_AMBULATORY_CARE_PROVIDER_SITE_OTHER)
Admission: RE | Admit: 2017-07-12 | Discharge: 2017-07-12 | Disposition: A | Discharge status: Home / Self Care | Payer: Managed Care, Other (non HMO) | Source: Ambulatory Visit | Attending: Internal Medicine | Admitting: Internal Medicine

## 2017-07-12 DIAGNOSIS — K5000 Crohn's disease of small intestine without complications: Secondary | ICD-10-CM

## 2017-07-12 DIAGNOSIS — K633 Ulcer of intestine: Secondary | ICD-10-CM | POA: Diagnosis not present

## 2017-07-12 DIAGNOSIS — R194 Change in bowel habit: Secondary | ICD-10-CM

## 2017-07-12 MED ORDER — IOPAMIDOL (ISOVUE-300) INJECTION 61%
100.0000 mL | Freq: Once | INTRAVENOUS | Status: AC | PRN
  Administered 2017-07-12: 100 mL via INTRAVENOUS

## 2017-07-13 NOTE — Progress Notes (Signed)
Let him know that this test is ok - no signs of Crohn's disease or problems after prior small bowel resection.  It still possible he has Crohn's but not "bad enough" to show up here.  Options he has are: 1) Observe further - see me again in 2 months and sooner prn 2) Trial of Entocort to treat Crohn's and see if he responds - if so we would presume he does have Crohn's and determine longer-term treatment plan would Rx 9 mg daily # 90 3 mg capsules and 1 refill 3) Repeat a colonoscopy to see what things look like   I favor # 2 but any of the three are reasonable assuming he is not significantly worse or resolved  Let me know what he decised or if ?

## 2017-09-15 ENCOUNTER — Encounter: Payer: Self-pay | Admitting: Internal Medicine

## 2017-09-15 ENCOUNTER — Ambulatory Visit: Payer: Managed Care, Other (non HMO) | Admitting: Internal Medicine

## 2017-09-15 VITALS — BP 106/70 | HR 59 | Ht 70.0 in | Wt 160.2 lb

## 2017-09-15 DIAGNOSIS — R194 Change in bowel habit: Secondary | ICD-10-CM | POA: Diagnosis not present

## 2017-09-15 DIAGNOSIS — K633 Ulcer of intestine: Secondary | ICD-10-CM

## 2017-09-15 NOTE — Progress Notes (Signed)
   Cory [name] 57 y.o. 1961-05-06 [id number]  Assessment & Plan:   Encounter Diagnoses  Name Primary?  . Change in bowel habit Yes  . Intestinal ulceration     The patient has a stable altered bowel habit from previous.  He and I both think observation makes sense.  I did explain that Crohn's disease is possible but the negative CT enterography was reassuring.  This could all be related to dietary changes.  He also has a history of intussusception repair in childhood and adhesions could play some sort of a role I suppose.  He will contact me as needed if anything changes.  I appreciate the opportunity to care for this patient. CC: [name], MD  Subjective:   Chief Complaint: Follow-up of bowel habit change in the setting of intestinal ulceration  HPI Patient is here for follow-up last seen in January, he had noticed his bowel movements were slower and less regular, he had a history of a terminal ileum ulceration at an ileocolonic anastomosis that is distal to the cecum.  That surgery is the result of intussusception repair and a 6 months.  In February of this year, CT enterography was performed to see if he had developed any type of inflammatory stricture as it is thought he could have Crohn's disease.  That examination was negative and his bowel habits are as he says a little bit slower than they used to be prior to late last year, but they are not any different over time lately.  There is no unintentional weight loss bleeding or significant abdominal pain described. Allergies  Allergen Reactions  . Penicillins     "bloats"   Current Meds  Medication Sig  . omeprazole (PRILOSEC OTC) 20 MG tablet Take 20 mg by mouth as needed. Using about 3 days a week.   Past Medical History:  Diagnosis Date  . Allergy   . Depression   . Hyperbilirubinemia   . MVP (mitral valve prolapse)   . Renal stone    Past Surgical History:  Procedure Laterality Date  . ARTHROSCOPIC REPAIR  ACL  1996  . COLONOSCOPY  2005   Repeat 2016 with terminal ileal ulceration  . 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   Review of Systems As per HPI  Objective:   Physical Exam BP 106/70   Pulse (!) 59   Ht 5' 10"  (1.778 m)   Wt 160 lb 3.2 oz (72.7 kg)   BMI 22.99 kg/m  Well-developed fit appearing white male in no acute distress   15 minutes time spent with patient > half in counseling coordination of care

## 2017-09-15 NOTE — Patient Instructions (Signed)
   Glad things are stable.  If you develop new changes in bowels, bleeding, significant pain or have questions please contact me.  I appreciate the opportunity to care for you. Gatha Mayer, MD, [facility]

## 2018-02-20 ENCOUNTER — Other Ambulatory Visit: Payer: Self-pay

## 2018-02-20 ENCOUNTER — Emergency Department
Admission: EM | Admit: 2018-02-20 | Discharge: 2018-02-20 | Disposition: A | Discharge status: Home / Self Care | Payer: Managed Care, Other (non HMO) | Attending: Emergency Medicine | Admitting: Emergency Medicine

## 2018-02-20 ENCOUNTER — Ambulatory Visit (INDEPENDENT_AMBULATORY_CARE_PROVIDER_SITE_OTHER)
Admission: EM | Admit: 2018-02-20 | Discharge: 2018-02-20 | Disposition: A | Discharge status: Nursing Home (Includes ICF) | Payer: Managed Care, Other (non HMO) | Source: Home / Self Care | Attending: Family Medicine | Admitting: Family Medicine

## 2018-02-20 ENCOUNTER — Encounter: Payer: Self-pay | Admitting: Emergency Medicine

## 2018-02-20 DIAGNOSIS — Y939 Activity, unspecified: Secondary | ICD-10-CM | POA: Diagnosis not present

## 2018-02-20 DIAGNOSIS — T63461A Toxic effect of venom of wasps, accidental (unintentional), initial encounter: Secondary | ICD-10-CM

## 2018-02-20 DIAGNOSIS — Z79899 Other long term (current) drug therapy: Secondary | ICD-10-CM | POA: Insufficient documentation

## 2018-02-20 DIAGNOSIS — I9589 Other hypotension: Secondary | ICD-10-CM

## 2018-02-20 DIAGNOSIS — Y929 Unspecified place or not applicable: Secondary | ICD-10-CM | POA: Insufficient documentation

## 2018-02-20 DIAGNOSIS — L509 Urticaria, unspecified: Secondary | ICD-10-CM | POA: Insufficient documentation

## 2018-02-20 DIAGNOSIS — T782XXA Anaphylactic shock, unspecified, initial encounter: Secondary | ICD-10-CM

## 2018-02-20 DIAGNOSIS — W57XXXA Bitten or stung by nonvenomous insect and other nonvenomous arthropods, initial encounter: Secondary | ICD-10-CM | POA: Insufficient documentation

## 2018-02-20 DIAGNOSIS — Y990 Civilian activity done for income or pay: Secondary | ICD-10-CM | POA: Diagnosis not present

## 2018-02-20 DIAGNOSIS — I959 Hypotension, unspecified: Secondary | ICD-10-CM

## 2018-02-20 MED ORDER — FAMOTIDINE 20 MG PO TABS: 20.0000 mg | ORAL_TABLET | Freq: Two times a day (BID) | ORAL | 0 refills | Status: DC

## 2018-02-20 MED ORDER — FAMOTIDINE IN NACL 20-0.9 MG/50ML-% IV SOLN: 20.0000 mg | Freq: Once | INTRAVENOUS | Status: DC

## 2018-02-20 MED ORDER — METHYLPREDNISOLONE SODIUM SUCC 125 MG IJ SOLR
125.0000 mg | Freq: Once | INTRAMUSCULAR | Status: AC
  Administered 2018-02-20: 125 mg via INTRAMUSCULAR

## 2018-02-20 MED ORDER — EPINEPHRINE PF 1 MG/ML IJ SOLN: 0.5000 mg | Freq: Once | INTRAMUSCULAR | Status: DC

## 2018-02-20 MED ORDER — FAMOTIDINE 20 MG PO TABS
20.0000 mg | ORAL_TABLET | Freq: Once | ORAL | Status: AC
  Administered 2018-02-20: 20 mg via ORAL

## 2018-02-20 MED ORDER — CETIRIZINE HCL 10 MG PO CAPS: 10.0000 mg | ORAL_CAPSULE | Freq: Three times a day (TID) | ORAL | 0 refills | Status: DC

## 2018-02-20 MED ORDER — SODIUM CHLORIDE 0.9 % IV BOLUS
1000.0000 mL | Freq: Once | INTRAVENOUS | Status: DC
  Administered 2018-02-20: 1000 mL via INTRAVENOUS

## 2018-02-20 MED ORDER — EPINEPHRINE 0.3 MG/0.3ML IJ SOAJ: 0.3000 mg | Freq: Once | INTRAMUSCULAR | 0 refills | Status: DC

## 2018-02-20 MED ORDER — EPINEPHRINE 0.3 MG/0.3ML IJ SOAJ: 0.3000 mg | Freq: Once | INTRAMUSCULAR | 2 refills | Status: AC

## 2018-02-20 MED ORDER — EPINEPHRINE PF 1 MG/ML IJ SOLN
0.5000 mg | Freq: Once | INTRAMUSCULAR | Status: AC
  Administered 2018-02-20: 0.5 mg via INTRAMUSCULAR

## 2018-02-20 MED ORDER — PREDNISONE 50 MG PO TABS: 50.0000 mg | ORAL_TABLET | Freq: Every day | ORAL | 0 refills | Status: AC

## 2018-02-20 NOTE — Discharge Instructions (Signed)
Please take Zyrtec 3 times a day for the next week along with Pepcid 2 times a day for the next week in addition to all of your steroids.  Follow-up with your primary care physician as needed and return to the emergency department for any concerns.  It was a pleasure to take care of you today, and thank you for coming to our emergency department.  If you have any questions or concerns before leaving please ask the nurse to grab me and I'm more than happy to go through your aftercare instructions again.  If you have any concerns once you are home that you are not improving or are in fact getting worse before you can make it to your follow-up appointment, please do not hesitate to call 911 and come back for further evaluation.  Darel Hong, MD

## 2018-02-20 NOTE — ED Triage Notes (Signed)
Pt brought in by [facility] after being picked up from [facility]. He was blowing leaves this morning and got stung by yellow jackets. 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 Swelling noted to lips. Rash and hives noted to legs. He was given Epinephrine, 125 mg Solumedrol, and 20 mg Pepcid at urgent care. Pt states he feels better. Breath sounds clear and equal bilaterally. Reports that lip swelling is better. Has felt chest pressure but denies shortness of breath.

## 2018-02-20 NOTE — ED Provider Notes (Signed)
[facility] Emergency Department Provider Note  ____________________________________________   First MD Initiated Contact with Patient 02/20/18 1158     (approximate)  I have reviewed the triage vital signs and the nursing notes.   HISTORY  Chief Complaint Allergic Reaction   HPI Cory [name] is a 57 y.o. male who comes to the emergency department via EMS from urgent care for anaphylaxis.  He was working outside today when he was stung by perhaps 10 yellow jackets including a sting to his lip and multiple to his back.  He drove himself to urgent care and was noted to be diaphoretic hypotensive to the 80s along with a urticarial rash and reported shortness of breath.  He was given 0.5 mg of intramuscular epinephrine along with IV Solu-Medrol and IV Pepcid.  He took 75 mg of oral Benadryl prior to urgent care.  He is also been given some IV fluids.  EMS has noted that the patient has notably improved in route.  His symptoms came on suddenly were severe.  Improved with epinephrine and worsened with envenomation.    Past Medical History:  Diagnosis Date  . Allergy   . Depression   . Hyperbilirubinemia   . MVP (mitral valve prolapse)   . Renal stone     Patient Active Problem List   Diagnosis Date Noted  . Intestinal ulceration 09/13/2014  . Dysthymia 11/30/2011  . Gilbert's disease 11/30/2011  . Allergic rhinitis, seasonal 11/30/2011  . History of renal stone 11/30/2011  . Family history of ischemic heart disease 11/30/2011    Past Surgical History:  Procedure Laterality Date  . ARTHROSCOPIC REPAIR ACL  1996  . COLONOSCOPY  2005   Repeat 2016 with terminal ileal ulceration  . INTUSSUSCEPTION REPAIR     Childhood    Prior to Admission medications   Medication Sig Start Date End Date Taking? Authorizing Provider  buPROPion HCl (WELLBUTRIN PO) Take by mouth.    [provider]  Cetirizine HCl (ZYRTEC ALLERGY) 10 MG CAPS Take 1 capsule  (10 mg total) by mouth 3 (three) times daily for 7 days. I specifically want this medication given TID as it's for anaphylaxis - I know it's typically daily, but the anaphylaxis dose goes BID --> QID 02/20/18 02/27/18  Darel Hong, MD  famotidine (PEPCID) 20 MG tablet Take 1 tablet (20 mg total) by mouth 2 (two) times daily for 7 days. 02/20/18 02/27/18  Darel Hong, MD  omeprazole (PRILOSEC OTC) 20 MG tablet Take 20 mg by mouth as needed. Using about 3 days a week.    [provider]  predniSONE (DELTASONE) 50 MG tablet Take 1 tablet (50 mg total) by mouth daily for 4 days. 02/20/18 02/24/18  Darel Hong, MD    Allergies Bee pollen and Penicillins  Family History  Problem Relation Age of Onset  . Cancer Mother   . Heart disease Father   . Esophageal cancer Father   . Cancer Paternal Grandfather   . Heart disease Paternal Grandfather   . Colon cancer Neg Hx   . Rectal cancer Neg Hx   . Stomach cancer Neg Hx     Social History Social History   Tobacco Use  . Smoking status: Never Smoker  . Smokeless tobacco: Never Used  Substance Use Topics  . Alcohol use: Yes    Alcohol/week: 2.0 standard drinks    Types: 2 Standard drinks or equivalent per week    Comment: occassionally  . Drug use: No  Review of Systems Constitutional: No fever/chills Eyes: No visual changes. ENT: Positive for throat swelling Cardiovascular: Positive for chest pain. Respiratory: Positive for shortness of breath. Gastrointestinal: No abdominal pain.  Positive for nausea, no vomiting.  No diarrhea.  No constipation. Genitourinary: Negative for dysuria. Musculoskeletal: Negative for back pain. Skin: Positive for for rash. Neurological: Negative for headaches, focal weakness or numbness.   ____________________________________________   PHYSICAL EXAM:  VITAL SIGNS: ED Triage Vitals  Enc Vitals Group     BP      Pulse      Resp      Temp      Temp src      SpO2      Weight        Height      Head Circumference      Peak Flow      Pain Score      Pain Loc      Pain Edu?      Excl. in [place]?     Constitutional: Alert and oriented x4 pleasant cooperative speaks in full clear sentences no diaphoresis Eyes: PERRL EOMI. Head: Atraumatic. Nose: No congestion/rhinnorhea. Mouth/Throat: No trismus Neck: No stridor.   Cardiovascular: Normal rate, regular rhythm. Grossly normal heart sounds.  Good peripheral circulation. Respiratory: Normal respiratory effort.  No retractions. Lungs CTAB and moving good air Gastrointestinal: Soft nontender Musculoskeletal: No lower extremity edema   Neurologic:  Normal speech and language. No gross focal neurologic deficits are appreciated. Skin:  Skin is warm, dry and intact. No rash noted. Psychiatric: Mood and affect are normal. Speech and behavior are normal.    ____________________________________________   DIFFERENTIAL includes but not limited to  Anaphylactic shock, anaphylaxis, allergic reaction, angioedema ____________________________________________   LABS (all labs ordered are listed, but only abnormal results are displayed)  Labs Reviewed - No data to display   __________________________________________  EKG   ____________________________________________  RADIOLOGY   ____________________________________________   PROCEDURES  Procedure(s) performed: no  Procedures  Critical Care performed: no  ____________________________________________   INITIAL IMPRESSION / ASSESSMENT AND PLAN / ED COURSE  Pertinent labs & imaging results that were available during my care of the patient were reviewed by me and considered in my medical decision making (see chart for details).   As part of my medical decision making, I reviewed the following data within the [facility] [place] History obtained from family if available, nursing notes, old chart and ekg, as well as notes from prior ED visits.  By  the time the patient got to me his symptoms are essentially completely resolved.  It sounds like he had true anaphylaxis versus anaphylactic shock in urgent care and they provided the appropriate treatment.  As he required epinephrine to resolve he requires 4 hours of observation from the epinephrine.  He verbalizes understanding and agrees to stay.     ----------------------------------------- 2:27 PM on 02/20/2018 -----------------------------------------  It has been 4 hours since epinephrine and the patient is asymptomatic.  I will prescribe him cetirizine Pepcid and prednisone for home.  He already has epi-pens at home.  Strict return precautions have been given and he understands that a biphasic reaction still could happen even at home. ____________________________________________   FINAL CLINICAL IMPRESSION(S) / ED DIAGNOSES  Final diagnoses:  Anaphylactic shock      NEW MEDICATIONS STARTED DURING THIS VISIT:  Discharge Medication List as of 02/20/2018  2:27 PM    START taking these medications   Details  Cetirizine  HCl (ZYRTEC ALLERGY) 10 MG CAPS Take 1 capsule (10 mg total) by mouth 3 (three) times daily for 7 days. I specifically want this medication given TID as it's for anaphylaxis - I know it's typically daily, but the anaphylaxis dose goes BID --> QID, Starting Sun 02/20/2018, Until Sun 9/29 /2019, Print    famotidine (PEPCID) 20 MG tablet Take 1 tablet (20 mg total) by mouth 2 (two) times daily for 7 days., Starting Sun 02/20/2018, Until Sun 02/27/2018, Print    predniSONE (DELTASONE) 50 MG tablet Take 1 tablet (50 mg total) by mouth daily for 4 days., Starting Sun 02/20/2018, Until Thu 02/24/2018, Print         Note:  This document was prepared using Dragon voice recognition software and may include unintentional dictation errors.     Darel Hong, MD 02/22/18 1445

## 2018-02-20 NOTE — ED Provider Notes (Signed)
[facility]    CSN: [id number] Arrival date & time: 02/20/18  1050     History   Chief Complaint Chief Complaint  Patient presents with  . Insect Bite    bee  . Allergic Reaction    HPI Cory [name] is a 57 y.o. male.   57 yo male   The history is provided by the patient.  Allergic Reaction  Presenting symptoms: itching, rash and swelling   Presenting symptoms: no difficulty breathing, no difficulty swallowing and no wheezing   Presenting symptoms comment:  Sweating and states feels "woozy" and lightheaded Severity:  Severe Duration:  30 minutes Prior allergic episodes:  Insect allergies (prior allergic reaction to wasp stings) Context: insect bite/sting (patient was stung by about 10 yellow jackets about 30 minutes ago; patient states he took 28m of benadryl prior to arrival here)   Context: not animal exposure, not chemicals, not cosmetics, not dairy/milk products, not eggs, not food allergies, not grass, not jewelry/metal, not medications, not new detergents/soaps, not nuts and not poison ivy     Past Medical History:  Diagnosis Date  . Allergy   . Depression   . Hyperbilirubinemia   . MVP (mitral valve prolapse)   . Renal stone     Patient Active Problem List   Diagnosis Date Noted  . Intestinal ulceration 09/13/2014  . Dysthymia 11/30/2011  . Gilbert's disease 11/30/2011  . Allergic rhinitis, seasonal 11/30/2011  . History of renal stone 11/30/2011  . Family history of ischemic heart disease 11/30/2011    Past Surgical History:  Procedure Laterality Date  . ARTHROSCOPIC REPAIR ACL  1996  . COLONOSCOPY  2005   Repeat 2016 with terminal ileal ulceration  . INTUSSUSCEPTION REPAIR     Childhood       Home Medications    Prior to Admission medications   Medication Sig Start Date End Date Taking? Authorizing Provider  buPROPion HCl (WELLBUTRIN PO) Take by mouth.   Yes [provider]  omeprazole (PRILOSEC OTC) 20 MG tablet  Take 20 mg by mouth as needed. Using about 3 days a week.   Yes [provider]  EPINEPHrine (EPIPEN 2-PAK) 0.3 mg/0.3 mL IJ SOAJ injection Inject 0.3 mLs (0.3 mg total) into the muscle once for 1 dose. 02/20/18 02/20/18  CNorval Gable MD    Family History Family History  Problem Relation Age of Onset  . Cancer Mother   . Heart disease Father   . Esophageal cancer Father   . Cancer Paternal Grandfather   . Heart disease Paternal Grandfather   . Colon cancer Neg Hx   . Rectal cancer Neg Hx   . Stomach cancer Neg Hx     Social History Social History   Tobacco Use  . Smoking status: Never Smoker  . Smokeless tobacco: Never Used  Substance Use Topics  . Alcohol use: Yes    Alcohol/week: 2.0 standard drinks    Types: 2 Standard drinks or equivalent per week    Comment: occassionally  . Drug use: No     Allergies   Bee pollen and Penicillins   Review of Systems Review of Systems  HENT: Negative for trouble swallowing.   Respiratory: Negative for wheezing.   Skin: Positive for itching and rash.     Physical Exam Triage Vital Signs ED Triage Vitals  Enc Vitals Group     BP 02/20/18 1057 (!) 84/54     Pulse Rate 02/20/18 1057 79  Resp 02/20/18 1057 16     Temp 02/20/18 1057 97.9 F (36.6 C)     Temp Source 02/20/18 1057 Oral     SpO2 02/20/18 1057 97 %     Weight 02/20/18 1055 155 lb (70.3 kg)     Height 02/20/18 1055 5' 10"  (1.778 m)     Head Circumference --      Peak Flow --      Pain Score 02/20/18 1055 2     Pain Loc --      Pain Edu? --      Excl. in [place]? --    No data found.  Updated Vital Signs BP 111/73 (BP Location: Left Arm)   Pulse 79   Temp 97.9 F (36.6 C) (Oral)   Resp 16   Ht 5' 10"  (1.778 m)   Wt 70.3 kg   SpO2 97%   BMI 22.24 kg/m   Visual Acuity Right Eye Distance:   Left Eye Distance:   Bilateral Distance:    Right Eye Near:   Left Eye Near:    Bilateral Near:     Physical Exam  Constitutional: He appears  well-developed and well-nourished. He is cooperative. He appears ill. No distress.  Neurological: He is alert.  Skin: Rash noted. Rash is urticarial. He is diaphoretic. There is erythema.  Diffuse hives, urticarial, over trunk, face, extremities; diffuse lower lip edema  Nursing note and vitals reviewed.    UC Treatments / Results  Labs (all labs ordered are listed, but only abnormal results are displayed) Labs Reviewed - No data to display  EKG None  Radiology No results found.  Procedures Procedures (including critical care time)  Medications Ordered in UC Medications  sodium chloride 0.9 % bolus 1,000 mL (1,000 mLs Intravenous New Bag/Given 02/20/18 1121)  methylPREDNISolone sodium succinate (SOLU-MEDROL) 125 mg/2 mL injection 125 mg (125 mg Intramuscular Given 02/20/18 1111)  EPINEPHrine (ADRENALIN) 0.5 mg (0.5 mg Intramuscular Given 02/20/18 1113)  famotidine (PEPCID) tablet 20 mg (20 mg Oral Given 02/20/18 1122)    Initial Impression / Assessment and Plan / UC Course  I have reviewed the triage vital signs and the nursing notes.  Pertinent labs & imaging results that were available during my care of the patient were reviewed by me and considered in my medical decision making (see chart for details).      Final Clinical Impressions(s) / UC Diagnoses   Final diagnoses:  Anaphylaxis, initial encounter  Yellow jacket sting, accidental or unintentional, initial encounter  Symptomatic hypotension    ED Prescriptions    Medication Sig Dispense Auth. Provider   EPINEPHrine (EPIPEN 2-PAK) 0.3 mg/0.3 mL IJ SOAJ injection Inject 0.3 mLs (0.3 mg total) into the muscle once for 1 dose. 2 Device [place], Linward Foster, MD       1. diagnosis reviewed with patient; patient given 0.72m epinephrine IM x 1, solumedrol 1230mIM x 1, pepcid 2027mo x1. IV started, NS; (patient had taken 27m72m of Benadryl prior to arrival);   2. recommend patient go to Emergency Department by EMS for  further evaluation and management. Patient in stable condition transported by EMS. Report called to Dr. QualJacqualine Codetending [facility] at [facility]   Controlled Substance Prescriptions [place] Controlled Substance Registry consulted? Not Applicable   ContNorval Gable 02/20/18 1145

## 2018-02-20 NOTE — ED Notes (Signed)
Patient states that he took 3 tablets of Benadryl prior to coming here.

## 2018-02-20 NOTE — ED Triage Notes (Signed)
Patient got stung all over his body about 10 times about 30 min ago.  Patient has swelling in his lips and itching allo over.  Patient has allergic reaction to bee stings but does not have an epi pen with him.

## 2018-03-03 IMAGING — CT CT ENTEROGRAPHY (ABD-PELV W/ CM)
2 of 6 series · 15 of 46 positions shown, 17 images · IV contrast (ISOVUE 300)
Comparison: None.

CLINICAL DATA: Intestinal ulceration on colonoscopy. Remote history
of ileocolic anastomosis with history of ulceration in the neo
terminal ileum. Evaluate for Crohn disease.

EXAM:
CT ABDOMEN AND PELVIS WITH CONTRAST (ENTEROGRAPHY)
TECHNIQUE: Multidetector CT of the abdomen and pelvis during bolus
administration of intravenous contrast. Negative oral contrast was
given.
CONTRAST:  100mL UUKMII-N11 IOPAMIDOL (UUKMII-N11) INJECTION 61%

[Series 4: entero thins · axial · 0.74mm/px · z∈[-560,-118]mm · 12 of 247 slices shown, 14 images]
[im 13/247  soft-tissue]
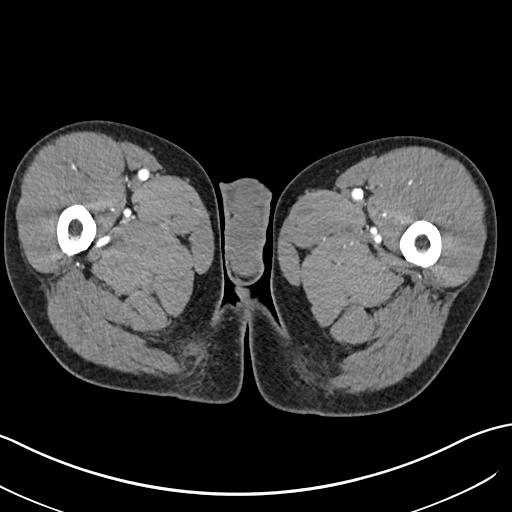
[im 13/247  bone]
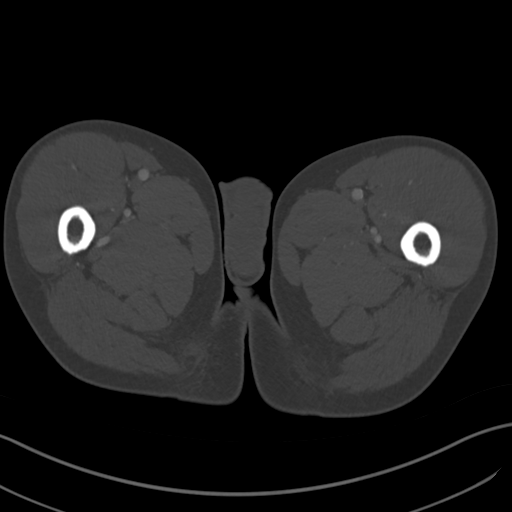
[im 39/247  soft-tissue]
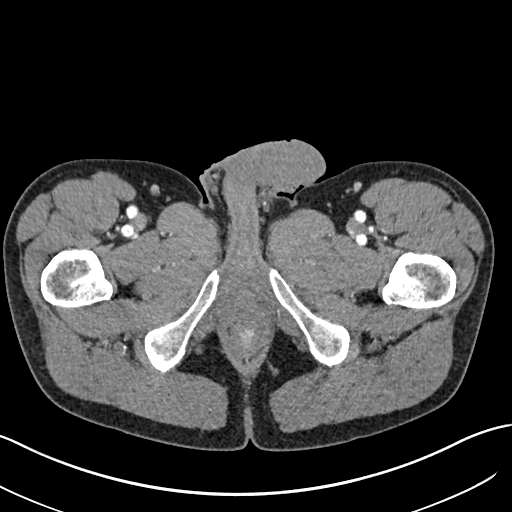
[im 52/247  soft-tissue]
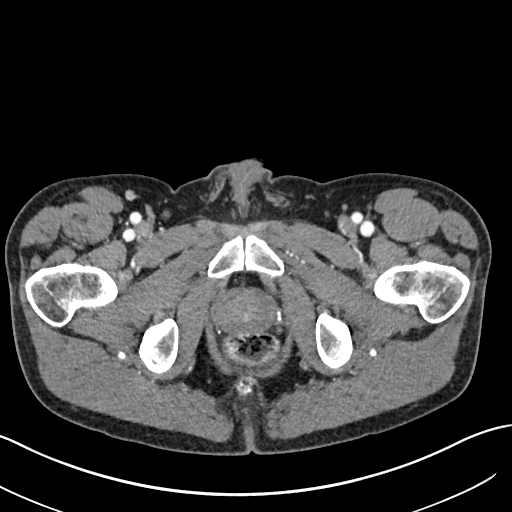
[im 78/247  soft-tissue]
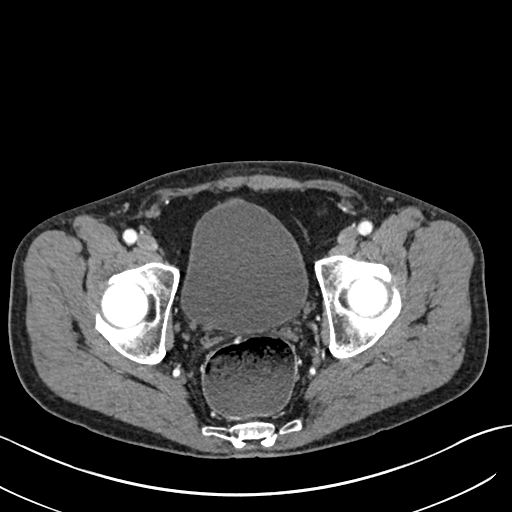
[im 91/247  soft-tissue]
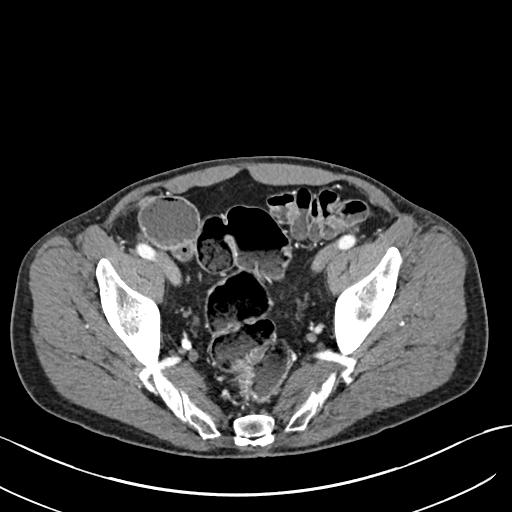
[im 117/247  soft-tissue]
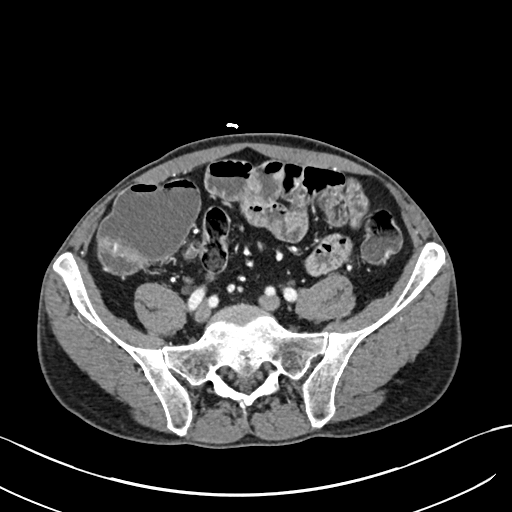
[im 130/247  soft-tissue]
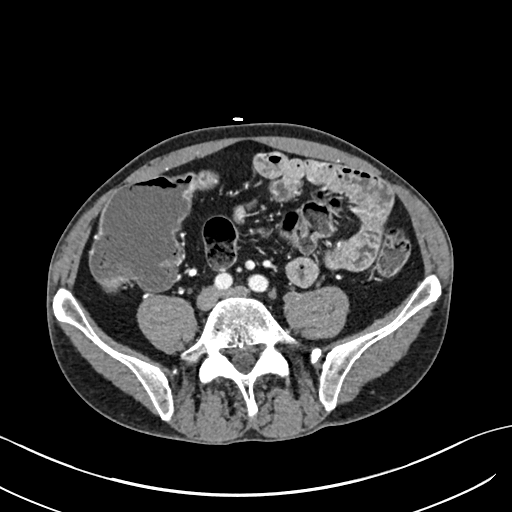
[im 156/247  soft-tissue]
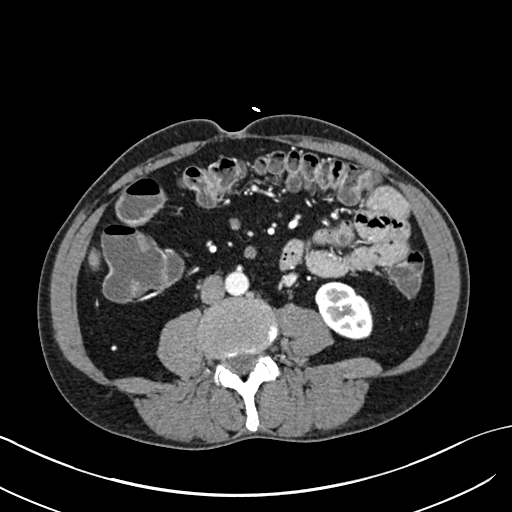
[im 169/247  soft-tissue]
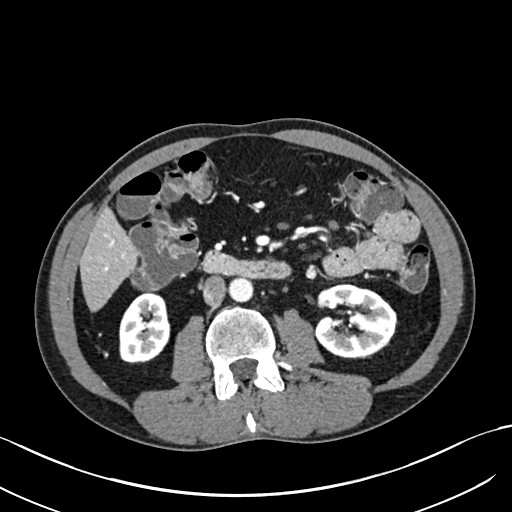
[im 169/247  bone]
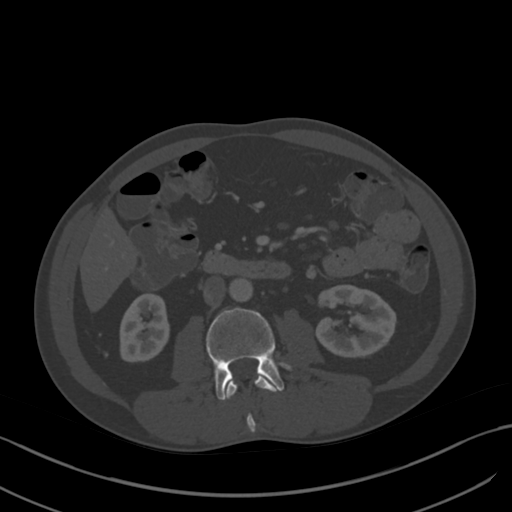
[im 195/247  soft-tissue]
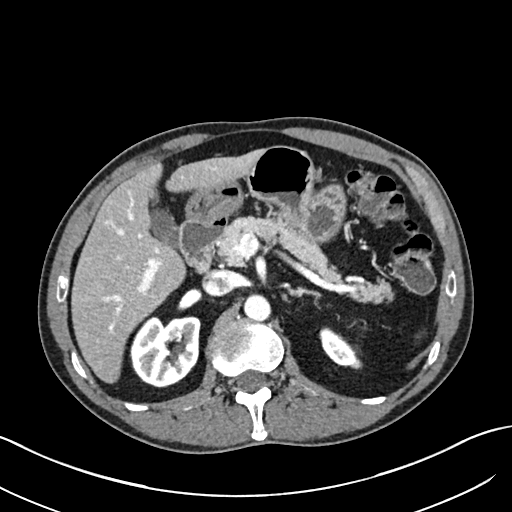
[im 208/247  soft-tissue]
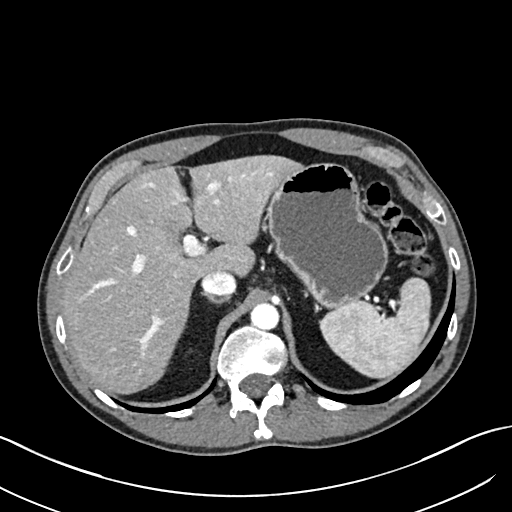
[im 234/247  soft-tissue]
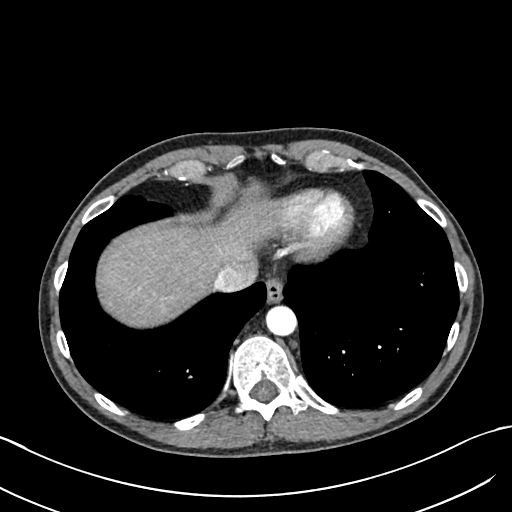

[Series 7: coronal · coronal · 0.68mm/px · 3 of 79 slices shown]
[im 27/79  soft-tissue]
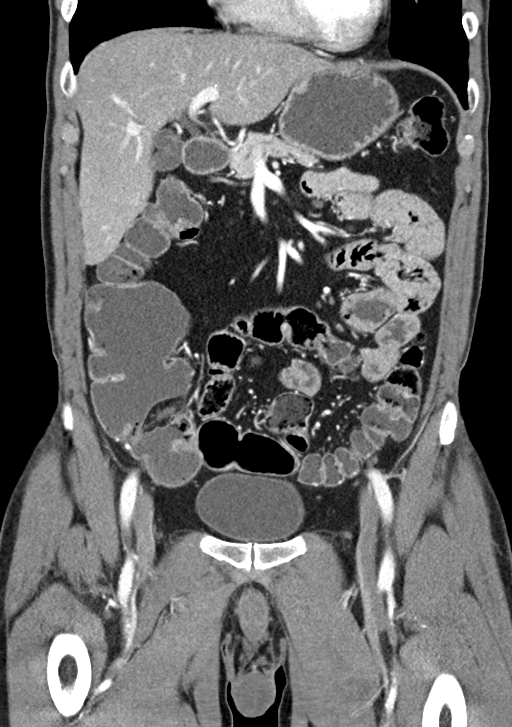
[im 35/79  soft-tissue]
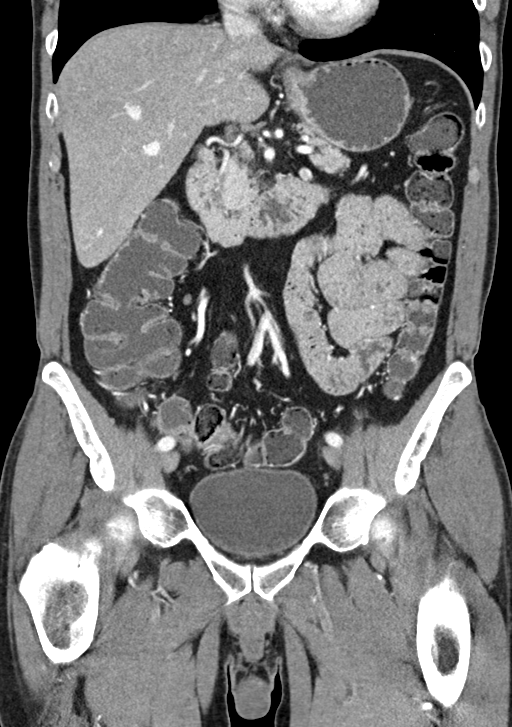
[im 44/79  soft-tissue]
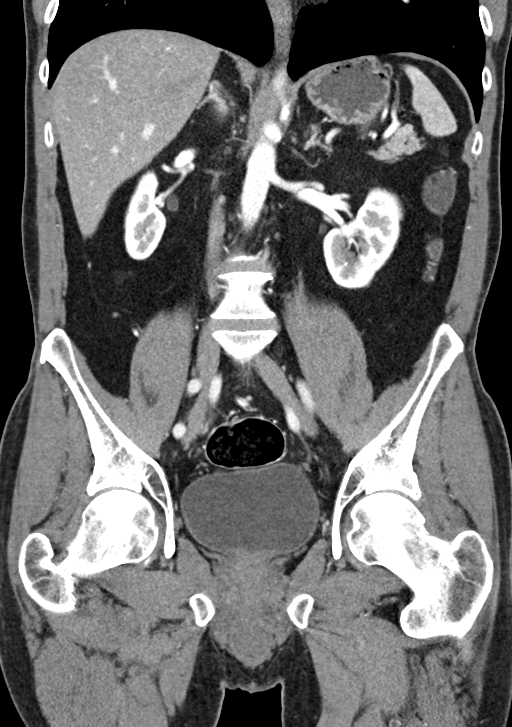

[15 of 46 positions shown; findings below may reference images not displayed]

FINDINGS: Lower chest:  Unremarkable

Hepatobiliary: No focal abnormality within the liver parenchyma.
There is no evidence for gallstones, gallbladder wall thickening, or
pericholecystic fluid. No intrahepatic or extrahepatic biliary
dilation.

Pancreas: No focal mass lesion. No dilatation of the main duct. No
intraparenchymal cyst. No peripancreatic edema.

Spleen: No splenomegaly. No focal mass lesion.

Adrenals/Urinary Tract: No adrenal nodule or mass. 16 x 11 mm stone
identified interpolar right kidney. Left kidney unremarkable. No
evidence for hydroureter. The urinary bladder appears normal for the
degree of distention.

Stomach/Bowel: Stomach is nondistended. No gastric wall thickening.
No evidence of outlet obstruction. No abnormal mucosal or transmural
enhancement in the stomach. Duodenum unremarkable. Jejunal loops
have normal wall thickness and enhancement characteristics. No wall
thickening or abnormal enhancement in the ileum. Ileocolic
anastomosis is unremarkable. Cecal tip identified with normal
appendix. There is an apparent stump of terminal ileum entering the
ileocecal valve without wall thickening. Mucosal enhancement in the
colon is diffuse throughout without wall thickening or definite
hyperenhancement. No evidence for inflammatory or fibro stenotic
small bowel stricture.

Vascular/Lymphatic: No abdominal aortic aneurysm. There is no
gastrohepatic or hepatoduodenal ligament lymphadenopathy. No
intraperitoneal or retroperitoneal lymphadenopathy. No pelvic
sidewall lymphadenopathy.

Reproductive: The prostate gland and seminal vesicles have normal
imaging features.

Other: No intraperitoneal free fluid.

Musculoskeletal: Bone windows reveal no worrisome lytic or sclerotic
osseous lesions.
IMPRESSION: 1. No abnormal wall thickening or abnormal wall enhancement in the
stomach, small bowel, or colon. No evidence for small bowel
stricture.
2. Side to side ileocolic anastomosis with cecal tip and appendix
still in place. There appears to be a stump of terminal ileum
entering the ileocecal valve.

## 2018-08-23 ENCOUNTER — Other Ambulatory Visit: Payer: Self-pay | Admitting: Family Medicine

## 2018-08-23 NOTE — Telephone Encounter (Signed)
[facility] is requesting to fill pt wellbutrin. Please advise. [place]

## 2018-12-19 ENCOUNTER — Other Ambulatory Visit: Payer: Self-pay | Admitting: Family Medicine

## 2018-12-20 NOTE — Telephone Encounter (Signed)
Pt was called to advise of med check appt needed. [place]

## 2018-12-20 NOTE — Telephone Encounter (Signed)
[facility] is requesting to fill pt wellbutrin. Please advise [facility]

## 2018-12-20 NOTE — Telephone Encounter (Signed)
Needs an appt

## 2019-01-17 ENCOUNTER — Telehealth: Payer: Self-pay

## 2019-01-17 NOTE — Telephone Encounter (Signed)
Pt call and need labs order print out to be picked up today . [facility] will be going to [facility] to have them done. Pt will schedule CPE later . Please advise when done so it can be placed up front. [place]

## 2019-01-18 ENCOUNTER — Other Ambulatory Visit: Payer: Self-pay | Admitting: Family Medicine

## 2019-01-18 LAB — SPECIMEN STATUS REPORT

## 2019-01-18 LAB — CBC WITH DIFFERENTIAL/PLATELET
Basophils Absolute: 0 10*3/uL (ref 0.0–0.2)
Basos: 1 %
EOS (ABSOLUTE): 0.2 10*3/uL (ref 0.0–0.4)
Eos: 4 %
Hematocrit: 42.1 % (ref 37.5–51.0)
Hemoglobin: 14 g/dL (ref 13.0–17.7)
Immature Grans (Abs): 0 10*3/uL (ref 0.0–0.1)
Immature Granulocytes: 0 %
Lymphocytes Absolute: 1.3 10*3/uL (ref 0.7–3.1)
Lymphs: 28 %
MCH: 32.5 pg (ref 26.6–33.0)
MCHC: 33.3 g/dL (ref 31.5–35.7)
MCV: 98 fL — ABNORMAL HIGH (ref 79–97)
Monocytes Absolute: 0.5 10*3/uL (ref 0.1–0.9)
Monocytes: 10 %
Neutrophils Absolute: 2.7 10*3/uL (ref 1.4–7.0)
Neutrophils: 57 %
Platelets: 207 10*3/uL (ref 150–450)
RBC: 4.31 x10E6/uL (ref 4.14–5.80)
RDW: 13.6 % (ref 11.6–15.4)
WBC: 4.6 10*3/uL (ref 3.4–10.8)

## 2019-01-18 LAB — COMPREHENSIVE METABOLIC PANEL
ALT: 15 IU/L (ref 0–44)
AST: 22 IU/L (ref 0–40)
Albumin/Globulin Ratio: 2.3 — ABNORMAL HIGH (ref 1.2–2.2)
Albumin: 4.4 g/dL (ref 3.8–4.9)
Alkaline Phosphatase: 65 IU/L (ref 39–117)
BUN/Creatinine Ratio: 9 (ref 9–20)
BUN: 10 mg/dL (ref 6–24)
Bilirubin Total: 2.1 mg/dL — ABNORMAL HIGH (ref 0.0–1.2)
CO2: 24 mmol/L (ref 20–29)
Calcium: 9.3 mg/dL (ref 8.7–10.2)
Chloride: 104 mmol/L (ref 96–106)
Creatinine, Ser: 1.15 mg/dL (ref 0.76–1.27)
GFR calc Af Amer: 81 mL/min/{1.73_m2} (ref >59)
GFR calc non Af Amer: 70 mL/min/{1.73_m2} (ref >59)
Globulin, Total: 1.9 g/dL (ref 1.5–4.5)
Glucose: 93 mg/dL (ref 65–99)
Potassium: 4.1 mmol/L (ref 3.5–5.2)
Sodium: 141 mmol/L (ref 134–144)
Total Protein: 6.3 g/dL (ref 6.0–8.5)

## 2019-01-18 LAB — LIPID PANEL W/O CHOL/HDL RATIO
Cholesterol, Total: 140 mg/dL (ref 100–199)
HDL: 66 mg/dL (ref >39)
LDL Calculated: 55 mg/dL (ref 0–99)
Triglycerides: 96 mg/dL (ref 0–149)
VLDL Cholesterol Cal: 19 mg/dL (ref 5–40)

## 2019-03-06 ENCOUNTER — Ambulatory Visit (INDEPENDENT_AMBULATORY_CARE_PROVIDER_SITE_OTHER): Payer: Managed Care, Other (non HMO) | Admitting: Family Medicine

## 2019-03-06 ENCOUNTER — Other Ambulatory Visit: Payer: Self-pay

## 2019-03-06 ENCOUNTER — Encounter: Payer: Self-pay | Admitting: Family Medicine

## 2019-03-06 VITALS — BP 106/70 | HR 70 | Temp 98.9°F | Ht 70.5 in | Wt 159.2 lb

## 2019-03-06 DIAGNOSIS — Z1159 Encounter for screening for other viral diseases: Secondary | ICD-10-CM

## 2019-03-06 DIAGNOSIS — Z23 Encounter for immunization: Secondary | ICD-10-CM

## 2019-03-06 DIAGNOSIS — K5000 Crohn's disease of small intestine without complications: Secondary | ICD-10-CM | POA: Insufficient documentation

## 2019-03-06 DIAGNOSIS — Z0000 Encounter for general adult medical examination without abnormal findings: Secondary | ICD-10-CM

## 2019-03-06 DIAGNOSIS — Z125 Encounter for screening for malignant neoplasm of prostate: Secondary | ICD-10-CM

## 2019-03-06 DIAGNOSIS — J301 Allergic rhinitis due to pollen: Secondary | ICD-10-CM | POA: Diagnosis not present

## 2019-03-06 DIAGNOSIS — Z91030 Bee allergy status: Secondary | ICD-10-CM

## 2019-03-06 DIAGNOSIS — F341 Dysthymic disorder: Secondary | ICD-10-CM | POA: Diagnosis not present

## 2019-03-06 DIAGNOSIS — M25561 Pain in right knee: Secondary | ICD-10-CM

## 2019-03-06 DIAGNOSIS — Z87442 Personal history of urinary calculi: Secondary | ICD-10-CM | POA: Diagnosis not present

## 2019-03-06 DIAGNOSIS — Z8249 Family history of ischemic heart disease and other diseases of the circulatory system: Secondary | ICD-10-CM

## 2019-03-06 MED ORDER — EPINEPHRINE 0.3 MG/0.3ML IJ SOAJ: 0.3000 mg | INTRAMUSCULAR | 1 refills | Status: DC | PRN

## 2019-03-06 MED ORDER — ALBUTEROL SULFATE HFA 108 (90 BASE) MCG/ACT IN AERS: 2.0000 | INHALATION_SPRAY | Freq: Four times a day (QID) | RESPIRATORY_TRACT | 1 refills | Status: DC | PRN

## 2019-03-06 NOTE — Progress Notes (Addendum)
   Subjective:    Patient ID: Cory [name], male    DOB: Oct 17, 1960, 58 y.o.   MRN: [id number]  HPI He is here for complete examination.  In May 2019 he had a significant hamstring injury and this took several months to resolve.  He started running again in January.  He has noted some numbness on the ball of the foot on the right as well as some heel discomfort.  The heel pain goes away relatively soon but the numbness on the ball of foot has stayed.  He has started running again but after about three quarters of a mile he notes some slight chest discomfort and stops shortly for rest but is unable to continue to run.  He does have spring and fall allergies.  He started taking his Wellbutrin again after he noticed increased difficulty with anxiety and inability to make a decision.  He is happy with being back on this.  He has had some difficulty with right knee pain but no locking or grinding.  There is a family history of heart disease with his father having a heart attack in his 27s.  He has seen Dr. Carlean Purl in the past and there is a question of Crohn's disease. His main GI complaint is a bloating sensation.  He does have a very significant bee sting allergy and would like a refill on his EpiPen.  He describes an anaphylactic reaction with his last bee sting.  He also has a previous history of renal stones but none recently.  Family and social history as well as health maintenance and immunizations was reviewed  Review of Systems  All other systems reviewed and are negative.      Objective:   Physical Exam Alert and in no distress. Tympanic membranes and canals are normal. Pharyngeal area is normal. Neck is supple without adenopathy or thyromegaly. Cardiac exam shows a regular sinus rhythm without murmurs or gallops. Lungs are clear to auscultation. Abdominal and genital exam is negative.  Right knee shows no effusion with good motion and no laxity.      Assessment & Plan:  Routine general  medical examination at a health care facility  Bee sting allergy - Plan: EPINEPHrine 0.3 mg/0.3 mL IJ SOAJ injection  Dysthymia :   Continue on Wellbutrin.  At this point I have no intention of stopping that medication.  Seasonal allergic rhinitis due to pollen - Plan: albuterol (VENTOLIN HFA) 108 (90 Base) MCG/ACT inhaler: This chest symptoms could possibly be related to EIA and I will therefore have him try Proventill and he will let me know how this works.  History of renal stone  Family history of ischemic heart disease: I discussed cardiology evaluation however he is not interested.  Need for influenza vaccination - Plan: Flu Vaccine QUAD 6+ mos PF IM (Fluarix Quad PF)  Right knee pain, unspecified chronicity - Plan: Ambulatory referral to Physical Therapy.  He will also need good stretching exercises for his hamstrings as they are probably type from his previous injury and might be contributing to his knee and foot troubles.  Screening for prostate cancer - Plan: PSA: Patient request  Need for hepatitis C screening test - Plan: Hepatitis C antibody  Crohn's disease of small intestine without complication ([place]), Chronic: This was brought up by Dr. Carlean Purl who says that some of his symptoms could be related to this but no diagnosis at the present time.

## 2019-03-07 LAB — HEPATITIS C ANTIBODY: Hep C Virus Ab: 0.1 s/co ratio (ref 0.0–0.9)

## 2019-03-07 LAB — PSA: Prostate Specific Ag, Serum: 0.5 ng/mL (ref 0.0–4.0)

## 2019-03-08 ENCOUNTER — Telehealth: Payer: Self-pay

## 2019-03-08 NOTE — Telephone Encounter (Signed)
Called [name] to ask if Cory [name] in [place], [place] is the location he is requesting to go to. Their office number is [phone] and their fax was [phone]. Please advise when [name] calls. [place]

## 2019-03-14 ENCOUNTER — Other Ambulatory Visit: Payer: Self-pay | Admitting: Family Medicine

## 2019-03-15 NOTE — Telephone Encounter (Signed)
[facility] is requesting to fill pt wellbutrin. Please advise [facility]

## 2019-12-17 ENCOUNTER — Encounter: Payer: Self-pay | Admitting: Emergency Medicine

## 2019-12-17 ENCOUNTER — Other Ambulatory Visit: Payer: Self-pay

## 2019-12-17 ENCOUNTER — Emergency Department
Admission: EM | Admit: 2019-12-17 | Discharge: 2019-12-17 | Disposition: A | Discharge status: Home / Self Care | Payer: Managed Care, Other (non HMO) | Attending: Emergency Medicine | Admitting: Emergency Medicine

## 2019-12-17 DIAGNOSIS — W57XXXA Bitten or stung by nonvenomous insect and other nonvenomous arthropods, initial encounter: Secondary | ICD-10-CM | POA: Diagnosis not present

## 2019-12-17 DIAGNOSIS — Y929 Unspecified place or not applicable: Secondary | ICD-10-CM | POA: Insufficient documentation

## 2019-12-17 DIAGNOSIS — Y999 Unspecified external cause status: Secondary | ICD-10-CM | POA: Insufficient documentation

## 2019-12-17 DIAGNOSIS — Y939 Activity, unspecified: Secondary | ICD-10-CM | POA: Diagnosis not present

## 2019-12-17 DIAGNOSIS — Z91030 Bee allergy status: Secondary | ICD-10-CM | POA: Diagnosis not present

## 2019-12-17 DIAGNOSIS — T7840XA Allergy, unspecified, initial encounter: Secondary | ICD-10-CM | POA: Insufficient documentation

## 2019-12-17 MED ORDER — EPINEPHRINE 0.3 MG/0.3ML IJ SOAJ: 0.3000 mg | INTRAMUSCULAR | 0 refills | Status: DC | PRN

## 2019-12-17 MED ORDER — PREDNISONE 20 MG PO TABS
60.0000 mg | ORAL_TABLET | Freq: Once | ORAL | Status: AC
  Administered 2019-12-17: 14:00:00 60 mg via ORAL
  Filled 2019-12-17: qty 3

## 2019-12-17 MED ORDER — DIPHENHYDRAMINE HCL 25 MG PO CAPS
50.0000 mg | ORAL_CAPSULE | Freq: Once | ORAL | Status: AC
  Administered 2019-12-17: 14:00:00 50 mg via ORAL
  Filled 2019-12-17: qty 2

## 2019-12-17 MED ORDER — PREDNISONE 20 MG PO TABS: 60.0000 mg | ORAL_TABLET | Freq: Every day | ORAL | 0 refills | Status: AC

## 2019-12-17 NOTE — ED Notes (Signed)
Pt states that stings on arms are getting worse. Continues to deny SOB or throat swelling. Sting swelling is localized. Given warm blanket. Dr Joni Fears notified.

## 2019-12-17 NOTE — ED Provider Notes (Signed)
[facility] Emergency Department Provider Note ____________________________________________   First MD Initiated Contact with Patient 12/17/19 1334     (approximate)  I have reviewed the triage vital signs and the nursing notes.   HISTORY  Chief Complaint Allergic Reaction    HPI Cory [name] is a 59 y.o. male with PMH as noted below including prior allergic reaction to bee stings who presents with hives and swelling to his extremities, acute onset when he states he was stung by multiple yellow jackets less than an hour prior to arrival. He used his EpiPen and states that the symptoms are somewhat improving. He denies any shortness of breath, tightness in his throat, facial swelling, or diffuse rash.  Past Medical History:  Diagnosis Date  . Allergy   . Depression   . Hyperbilirubinemia   . MVP (mitral valve prolapse)   . Renal stone     Patient Active Problem List   Diagnosis Date Noted  . Crohn's disease of small intestine without complication ([place]) 17/79/3903  . Intestinal ulceration 09/13/2014  . Dysthymia 11/30/2011  . Gilbert's disease 11/30/2011  . Allergic rhinitis, seasonal 11/30/2011  . History of renal stone 11/30/2011  . Family history of ischemic heart disease 11/30/2011    Past Surgical History:  Procedure Laterality Date  . ARTHROSCOPIC REPAIR ACL  1996  . COLONOSCOPY  2005   Repeat 2016 with terminal ileal ulceration  . INTUSSUSCEPTION REPAIR     Childhood    Prior to Admission medications   Medication Sig Start Date End Date Taking? Authorizing Provider  Acetaminophen (TYLENOL PO) Take by mouth. With caffeine for headache    [provider]  albuterol (VENTOLIN HFA) 108 (90 Base) MCG/ACT inhaler Inhale 2 puffs into the lungs every 6 (six) hours as needed for wheezing or shortness of breath. 03/06/19   [name], MD  buPROPion (WELLBUTRIN SR) 200 MG 12 hr tablet TAKE 1 TABLET BY MOUTH  TWICE DAILY Patient  taking differently: Take 200 mg by mouth 2 (two) times daily.  03/15/19   [name], MD  Cetirizine HCl (ZYRTEC ALLERGY) 10 MG CAPS Take 1 capsule (10 mg total) by mouth 3 (three) times daily for 7 days. I specifically want this medication given TID as it's for anaphylaxis - I know it's typically daily, but the anaphylaxis dose goes BID --> QID 02/20/18 02/27/18  Darel Hong, MD  EPINEPHrine (EPIPEN 2-PAK) 0.3 mg/0.3 mL IJ SOAJ injection Inject 0.3 mLs (0.3 mg total) into the muscle as needed for anaphylaxis. 12/17/19   Arta Silence, MD  famotidine (PEPCID) 20 MG tablet Take 1 tablet (20 mg total) by mouth 2 (two) times daily for 7 days. 02/20/18 02/27/18  Darel Hong, MD  omeprazole (PRILOSEC OTC) 20 MG tablet Take 20 mg by mouth as needed. Using about 3 days a week.    [provider]  predniSONE (DELTASONE) 20 MG tablet Take 3 tablets (60 mg total) by mouth daily for 4 days. Start the day after the ED visit 12/18/19 12/22/19  Arta Silence, MD    Allergies Bee pollen and Penicillins  Family History  Problem Relation Age of Onset  . Cancer Mother   . Heart disease Father   . Esophageal cancer Father   . Cancer Paternal Grandfather   . Heart disease Paternal Grandfather   . Colon cancer Neg Hx   . Rectal cancer Neg Hx   . Stomach cancer Neg Hx     Social History  Social History   Tobacco Use  . Smoking status: Never Smoker  . Smokeless tobacco: Never Used  Vaping Use  . Vaping Use: Never used  Substance Use Topics  . Alcohol use: Yes    Alcohol/week: 2.0 standard drinks    Types: 2 Standard drinks or equivalent per week    Comment: occassionally  . Drug use: No    Review of Systems  Constitutional: No fever/chills. Eyes: No redness. ENT: No sore throat. Cardiovascular: Denies chest pain. Respiratory: Denies shortness of breath. Gastrointestinal: No vomiting or diarrhea.  Genitourinary: Negative for dysuria.  Musculoskeletal: Negative for  back pain. Skin: Negative for rash. Positive for localized hives/swelling. Neurological: Negative for headaches, focal weakness or numbness.   ____________________________________________   PHYSICAL EXAM:  VITAL SIGNS: ED Triage Vitals  Enc Vitals Group     BP 12/17/19 1328 114/65     Pulse Rate 12/17/19 1328 74     Resp 12/17/19 1328 18     Temp 12/17/19 1328 98.2 F (36.8 C)     Temp Source 12/17/19 1328 Oral     SpO2 12/17/19 1328 96 %     Weight 12/17/19 1332 155 lb (70.3 kg)     Height 12/17/19 1332 5' 10"  (1.778 m)     Head Circumference --      Peak Flow --      Pain Score 12/17/19 1331 0     Pain Loc --      Pain Edu? --      Excl. in [place]? --     Constitutional: Alert and oriented. Well appearing and in no acute distress. Eyes: Conjunctivae are normal.  Head: Atraumatic. Nose: No congestion/rhinnorhea. Mouth/Throat: Mucous membranes are moist. Oropharynx clear. Neck: Normal range of motion.  Cardiovascular: Normal rate, regular rhythm. Grossly normal heart sounds.  Good peripheral circulation. Respiratory: Normal respiratory effort.  No retractions. Lungs CTAB. Gastrointestinal:  No distention.  Musculoskeletal: No lower extremity edema.  Extremities warm and well perfused.  Neurologic:  Normal speech and language. No gross focal neurologic deficits are appreciated.  Skin:  Skin is warm and dry. No rash noted. A few scattered hives/welts to the extremities localized to the areas of the stings. Psychiatric: Mood and affect are normal. Speech and behavior are normal.  ____________________________________________   LABS (all labs ordered are listed, but only abnormal results are displayed)  Labs Reviewed - No data to display ____________________________________________  EKG   ____________________________________________  RADIOLOGY    ____________________________________________   PROCEDURES  Procedure(s) performed: No  Procedures  Critical  Care performed: No ____________________________________________   INITIAL IMPRESSION / ASSESSMENT AND PLAN / ED COURSE  Pertinent labs & imaging results that were available during my care of the patient were reviewed by me and considered in my medical decision making (see chart for details).  59 year old male with PMH as noted above presents with apparent allergic reaction to multiple bee stings, and has had an allergic reaction to bee stings previously. He used his EpiPen at home prior to coming in.  I reviewed the past medical records in epic. The patient was most recently seen in the ED in 2019 with a similar presentation of multiple yellowjacket stings including his lip and back. At that time he was hypotensive and had a diffuse urticarial rash. He was given epinephrine, Benadryl, Solu-Medrol, and fluids.  On exam today, the patient is well-appearing. His vital signs are normal. The urticaria appear localized to the areas of the stings, without any diffuse  urticaria or rash. Oropharynx and lungs are clear.  Presentation is consistent with an allergic reaction. We will give Benadryl and prednisone and observe the patient for a few hours. Given that he did not have respiratory or severe systemic symptoms he does not need prolonged observation after the epinephrine.  ----------------------------------------- 3:35 PM on 12/17/2019 -----------------------------------------  Patient reports some improvement in the swelling and itching.  He continues to appear comfortable and has no new symptoms.  I offered additional observation in the ED for 1 to 2 hours versus discharging him home now.  The patient would prefer to go home now.  I counseled him and his wife extensively on return precautions, and they expressed understanding.  I prescribed a new EpiPen as well as a 4-day course of prednisone.   ____________________________________________   FINAL CLINICAL IMPRESSION(S) / ED DIAGNOSES  Final  diagnoses:  Allergic reaction, initial encounter      NEW MEDICATIONS STARTED DURING THIS VISIT:  New Prescriptions   EPINEPHRINE (EPIPEN 2-PAK) 0.3 MG/0.3 ML IJ SOAJ INJECTION    Inject 0.3 mLs (0.3 mg total) into the muscle as needed for anaphylaxis.   PREDNISONE (DELTASONE) 20 MG TABLET    Take 3 tablets (60 mg total) by mouth daily for 4 days. Start the day after the ED visit     Note:  This document was prepared using Dragon voice recognition software and may include unintentional dictation errors.    Arta Silence, MD 12/17/19 1536

## 2019-12-17 NOTE — Discharge Instructions (Signed)
Use the EpiPen as instructed if you have a new or recurrent severe allergic reaction.  Take the prednisone for the next 4 days after the ED visit.  Return to the ER immediately for new, worsening, recurrent hives, tightness in your throat, difficulty swallowing, shortness of breath, or any other new or worsening symptoms that concern you.

## 2019-12-17 NOTE — ED Triage Notes (Signed)
Pt to ED via POV c/o allergic reaction after being stung by yellow jackets. Pt states that he was stung 6-8 times. Pt used his epi pen around 38 minutes ago. Pt denies shortness of breath. Pt has swelling to the right hand, right upper arm, left wrist, left knee, and left shin. Pt is in NAD.

## 2020-01-31 ENCOUNTER — Other Ambulatory Visit: Payer: Self-pay | Admitting: Family Medicine

## 2020-02-01 NOTE — Telephone Encounter (Signed)
Is this okay to refill? 

## 2020-03-06 ENCOUNTER — Encounter: Payer: Managed Care, Other (non HMO) | Admitting: Family Medicine

## 2020-04-25 ENCOUNTER — Other Ambulatory Visit: Payer: Self-pay | Admitting: Family Medicine

## 2020-04-29 NOTE — Telephone Encounter (Signed)
Schedule him an appt. Then I will renew his med

## 2020-04-29 NOTE — Telephone Encounter (Signed)
[facility] is requesting to fill pt wellbutrin. Please advise [facility]

## 2020-04-29 NOTE — Telephone Encounter (Signed)
Pt has an appt 06/11/20. Please advise [facility]

## 2020-05-07 ENCOUNTER — Other Ambulatory Visit: Payer: Self-pay | Admitting: Family Medicine

## 2020-05-07 DIAGNOSIS — J301 Allergic rhinitis due to pollen: Secondary | ICD-10-CM

## 2020-05-07 NOTE — Telephone Encounter (Signed)
Pt. Requesting a refill on his Albuterol HFA inhaler pt. Last apt was 03/16/19 and next apt is 06/11/20.

## 2020-05-14 ENCOUNTER — Telehealth: Payer: Self-pay | Admitting: Family Medicine

## 2020-05-14 NOTE — Telephone Encounter (Signed)
Pt called and needs an order for his blood work before his CPE scheduled on 1/11

## 2020-06-03 ENCOUNTER — Other Ambulatory Visit: Payer: Self-pay | Admitting: Family Medicine

## 2020-06-04 LAB — CBC/DIFF AMBIGUOUS DEFAULT
Basophils Absolute: 0.1 10*3/uL (ref 0.0–0.2)
Basos: 1 %
EOS (ABSOLUTE): 0 10*3/uL (ref 0.0–0.4)
Eos: 1 %
Hematocrit: 38.7 % (ref 37.5–51.0)
Hemoglobin: 13.9 g/dL (ref 13.0–17.7)
Immature Grans (Abs): 0 10*3/uL (ref 0.0–0.1)
Immature Granulocytes: 0 %
Lymphocytes Absolute: 1.1 10*3/uL (ref 0.7–3.1)
Lymphs: 17 %
MCH: 33.8 pg — ABNORMAL HIGH (ref 26.6–33.0)
MCHC: 35.9 g/dL — ABNORMAL HIGH (ref 31.5–35.7)
MCV: 94 fL (ref 79–97)
Monocytes Absolute: 0.5 10*3/uL (ref 0.1–0.9)
Monocytes: 7 %
Neutrophils Absolute: 4.9 10*3/uL (ref 1.4–7.0)
Neutrophils: 74 %
Platelets: 209 10*3/uL (ref 150–450)
RBC: 4.11 x10E6/uL — ABNORMAL LOW (ref 4.14–5.80)
RDW: 12.8 % (ref 11.6–15.4)
WBC: 6.6 10*3/uL (ref 3.4–10.8)

## 2020-06-04 LAB — COMPREHENSIVE METABOLIC PANEL
ALT: 20 IU/L (ref 0–44)
AST: 42 IU/L — ABNORMAL HIGH (ref 0–40)
Albumin/Globulin Ratio: 2.3 — ABNORMAL HIGH (ref 1.2–2.2)
Albumin: 4.3 g/dL (ref 3.8–4.9)
Alkaline Phosphatase: 77 IU/L (ref 44–121)
BUN/Creatinine Ratio: 15 (ref 9–20)
BUN: 17 mg/dL (ref 6–24)
Bilirubin Total: 3.5 mg/dL — ABNORMAL HIGH (ref 0.0–1.2)
CO2: 23 mmol/L (ref 20–29)
Calcium: 9.4 mg/dL (ref 8.7–10.2)
Chloride: 102 mmol/L (ref 96–106)
Creatinine, Ser: 1.15 mg/dL (ref 0.76–1.27)
GFR calc Af Amer: 81 mL/min/{1.73_m2} (ref >59)
GFR calc non Af Amer: 70 mL/min/{1.73_m2} (ref >59)
Globulin, Total: 1.9 g/dL (ref 1.5–4.5)
Glucose: 75 mg/dL (ref 65–99)
Potassium: 3.7 mmol/L (ref 3.5–5.2)
Sodium: 141 mmol/L (ref 134–144)
Total Protein: 6.2 g/dL (ref 6.0–8.5)

## 2020-06-04 LAB — LIPID PANEL W/O CHOL/HDL RATIO
Cholesterol, Total: 139 mg/dL (ref 100–199)
HDL: 69 mg/dL (ref >39)
LDL Chol Calc (NIH): 58 mg/dL (ref 0–99)
Triglycerides: 53 mg/dL (ref 0–149)
VLDL Cholesterol Cal: 12 mg/dL (ref 5–40)

## 2020-06-11 ENCOUNTER — Other Ambulatory Visit: Payer: Self-pay

## 2020-06-11 ENCOUNTER — Ambulatory Visit: Payer: Managed Care, Other (non HMO) | Admitting: Family Medicine

## 2020-06-11 ENCOUNTER — Encounter: Payer: Self-pay | Admitting: Family Medicine

## 2020-06-11 VITALS — BP 106/68 | HR 70 | Temp 97.2°F | Ht 69.25 in | Wt 157.4 lb

## 2020-06-11 DIAGNOSIS — Z0000 Encounter for general adult medical examination without abnormal findings: Secondary | ICD-10-CM | POA: Diagnosis not present

## 2020-06-11 DIAGNOSIS — Z8249 Family history of ischemic heart disease and other diseases of the circulatory system: Secondary | ICD-10-CM

## 2020-06-11 DIAGNOSIS — M79675 Pain in left toe(s): Secondary | ICD-10-CM

## 2020-06-11 DIAGNOSIS — R0609 Other forms of dyspnea: Secondary | ICD-10-CM

## 2020-06-11 DIAGNOSIS — M25561 Pain in right knee: Secondary | ICD-10-CM

## 2020-06-11 DIAGNOSIS — R0600 Dyspnea, unspecified: Secondary | ICD-10-CM | POA: Diagnosis not present

## 2020-06-11 DIAGNOSIS — J301 Allergic rhinitis due to pollen: Secondary | ICD-10-CM

## 2020-06-11 DIAGNOSIS — M1990 Unspecified osteoarthritis, unspecified site: Secondary | ICD-10-CM

## 2020-06-11 DIAGNOSIS — H9313 Tinnitus, bilateral: Secondary | ICD-10-CM

## 2020-06-11 DIAGNOSIS — F902 Attention-deficit hyperactivity disorder, combined type: Secondary | ICD-10-CM | POA: Diagnosis not present

## 2020-06-11 DIAGNOSIS — K5000 Crohn's disease of small intestine without complications: Secondary | ICD-10-CM

## 2020-06-11 MED ORDER — AMPHETAMINE-DEXTROAMPHETAMINE 10 MG PO TABS: 10.0000 mg | ORAL_TABLET | Freq: Two times a day (BID) | ORAL | 0 refills | Status: DC

## 2020-06-11 NOTE — Patient Instructions (Signed)
Adderall  Does not work, how long does it work and do you have any trouble with it.

## 2020-06-11 NOTE — Progress Notes (Signed)
Subjective:    Patient ID: Cory [name], male    DOB: 11-27-1960, 60 y.o.   MRN: [id number]  HPI He is here for complete examination.  He is getting ready to run a marathon and is having some difficulty with right knee pain.  He states that he has had an ACL repair and is now having difficulty with pain and cracking in that joint.  He also has had some previous injury to his left great toe which gives him some discomfort as well.  He also complains of difficulty catching his breath with running.  He states that he has to periodically stop to catch his breath but does not describe chest pain.  He has tried albuterol for this but found it did not be very helpful.  He has also had some right wrist discomfort and states that he noted a couple of episodes of having bruising in that area.  He complains of tinnitus and is now having difficulty with that interfering with his ability to hear appropriately.  He continues have difficulty with bloating.  He has been seen by GI in the past there is a question of Crohn's disease.  He does use Prilosec on an as-needed basis.  He has had a colonoscopy and apparently does not need follow-up for 10 years.  He has a long history of difficulty with focus as well as anxiety associated with that.  In the past he had been on Wellbutrin but presently is not taking it.  He the lack of focus and inability to finish tasks has some quite concerned.  Further history indicates that he has had difficulty with this his entire life but was able to work through it through various coping mechanisms.  His allergies are under good control.  Family and social history as well as health maintenance and immunizations was reviewed.   Review of Systems  All other systems reviewed and are negative.      Objective:   Physical Exam Alert and in no distress. Tympanic membranes and canals are normal. Pharyngeal area is normal. Neck is supple without adenopathy or thyromegaly. Cardiac exam  shows a regular sinus rhythm without murmurs or gallops. Lungs are clear to auscultation. Exam of the right knee does show some slight arthritic type changes as well as some crepitus.  Left great toe also shows some arthritic lipping with good motion. EKG read by me shows no acute changes with a ventricular rate of 60.  Other parameters are normal. Adult ADHD questionnaire score is 45.     Assessment & Plan:  Routine general medical examination at a health care facility  DOE (dyspnea on exertion) - Plan: EKG 12-Lead, Ambulatory referral to Cardiology  Attention deficit hyperactivity disorder (ADHD), combined type - Plan: amphetamine-dextroamphetamine (ADDERALL) 10 MG tablet  Gilbert's disease  Seasonal allergic rhinitis due to pollen  Family history of ischemic heart disease  Crohn's disease of small intestine without complication ([place])  Right knee pain, unspecified chronicity - Plan: DG Knee Complete 4 Views Right  Great toe pain, left - Plan: DG Toe Great Left  Tinnitus of both ears - Plan: Ambulatory referral to Audiology  Arthritis Since he is getting ready for a marathon and still having difficulty with the shortness of breath I think cardiology evaluation is appropriate.  May possibly need to see pulmonary as well.  Also discussed the need for good rehab program for his knee and possible use of orthotics for his feet.  He will also  do some interval training to get ready for this.  Referral will be made for audiology since the tinnitus is bothersome for him.  He will continue to be followed by GI on an as-needed basis. Discussed the diagnosis of ADHD with him.  I will start him out on 10 mg of Adderall.  He will let me know if it works, how long if he has any difficulties with that.

## 2020-06-14 ENCOUNTER — Encounter: Payer: Self-pay | Admitting: Family Medicine

## 2020-06-14 ENCOUNTER — Telehealth: Payer: Self-pay

## 2020-06-14 NOTE — Telephone Encounter (Signed)
P.A. ADDERALL called pt and explained process

## 2020-06-16 NOTE — Telephone Encounter (Signed)
P.A. approved til 06/14/2021, sent pt Mychart message

## 2020-07-14 ENCOUNTER — Other Ambulatory Visit: Payer: Self-pay | Admitting: Family Medicine

## 2020-07-15 ENCOUNTER — Other Ambulatory Visit: Payer: Self-pay | Admitting: Family Medicine

## 2020-07-15 NOTE — Telephone Encounter (Signed)
[facility] bupropion. Please advise [facility]

## 2020-07-16 ENCOUNTER — Other Ambulatory Visit: Payer: Self-pay | Admitting: Family Medicine

## 2020-07-16 NOTE — Progress Notes (Addendum)
Cardiology Office Note:    Date:  07/18/2020   ID:  Cory [name], DOB May 30, 1961, MRN [id number]  PCP:  [name], MD   [place]  Cardiologist:  No primary care provider on file.  Advanced Practice Provider:  No care team member to display Electrophysiologist:  None   Referring MD: [name], MD    History of Present Illness:    Cory [name] is a 60 y.o. male with a hx of depression and ? MVP who was referred by Dr. Redmond School for worsening dyspnea on exertion and exertional chest pain.  Patient states that he used to be an avid runner but now he is finding that he is needing to slow down due to shortness of breath and chest discomfort, which has been ongoing for the past couple years. Specifically, it feels like an "ache in his chest" and radiates to the left shoulder. Pain goes away with stopping and deep breathing. He is trying to train for a marathon, but finds that he cannot push his pace to where he used to be able to due to SOB/chest discomfort. No symptoms at rest. No LE edema, orthopnea, or PND. Symptoms have persisted despite his increased training prompting him to come in for further evaluation.  Family history: Father had CABG at age 47; deceased at 42; Paternal GF: MI and deceased at age 53-60. Siblings are healthy.   Past Medical History:  Diagnosis Date  . Allergy   . Depression   . Hyperbilirubinemia   . MVP (mitral valve prolapse)   . Renal stone     Past Surgical History:  Procedure Laterality Date  . ARTHROSCOPIC REPAIR ACL  1996  . COLONOSCOPY  2005   Repeat 2016 with terminal ileal ulceration  . INTUSSUSCEPTION REPAIR     Childhood    Current Medications: Current Meds  Medication Sig  . Acetaminophen (TYLENOL PO) Take by mouth. With caffeine for headache  . albuterol (VENTOLIN HFA) 108 (90 Base) MCG/ACT inhaler TAKE 2 PUFFS BY MOUTH EVERY 6 HOURS AS NEEDED FOR WHEEZE OR SHORTNESS OF BREATH  .  amphetamine-dextroamphetamine (ADDERALL) 10 MG tablet Take 1 tablet (10 mg total) by mouth 2 (two) times daily.  Marland Kitchen aspirin EC 81 MG tablet Take 1 tablet (81 mg total) by mouth daily. Swallow whole.  Marland Kitchen EPINEPHrine (EPIPEN 2-PAK) 0.3 mg/0.3 mL IJ SOAJ injection Inject 0.3 mLs (0.3 mg total) into the muscle as needed for anaphylaxis.  . metoprolol tartrate (LOPRESSOR) 100 MG tablet Take one tablet by mouth 2 hours prior to CT  . omeprazole (PRILOSEC OTC) 20 MG tablet Take 20 mg by mouth as needed. Using about 3 days a week.  . [DISCONTINUED] buPROPion (WELLBUTRIN SR) 200 MG 12 hr tablet TAKE 1 TABLET BY MOUTH  TWICE DAILY     Allergies:   Bee pollen and Penicillins   Social History   Socioeconomic History  . Marital status: Married    Spouse name: Not on file  . Number of children: 3  . Years of education: Not on file  . Highest education level: Not on file  Occupational History  . Occupation: engineer/sales  Tobacco Use  . Smoking status: Never Smoker  . Smokeless tobacco: Never Used  Vaping Use  . Vaping Use: Never used  Substance and Sexual Activity  . Alcohol use: Yes    Alcohol/week: 2.0 standard drinks    Types: 2 Standard drinks or equivalent per week  Comment: occassionally  . Drug use: No  . Sexual activity: Yes  Other Topics Concern  . 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 There  is no history of Colon cancer, Rectal cancer, or Stomach cancer.  ROS:   Please see the history of present illness.    Review of Systems  Constitutional: Negative for chills, fever and malaise/fatigue.  HENT: Negative for hearing loss and sore throat.   Eyes: Negative for blurred vision and redness.  Respiratory: Positive for shortness of breath. Negative for cough.   Cardiovascular: Positive for chest pain. Negative for palpitations, orthopnea, claudication, leg swelling and PND.  Gastrointestinal: Negative for melena, nausea and vomiting.  Genitourinary: Negative for flank pain and hematuria.  Musculoskeletal: Negative for falls and neck pain.  Neurological: Negative for dizziness and loss of consciousness.  Endo/Heme/Allergies: Negative for polydipsia.  Psychiatric/Behavioral: Negative for substance abuse.    EKGs/Labs/Other Studies Reviewed:    The following studies were reviewed today: No cardiac studies  EKG:  EKG is  ordered today.  The ekg ordered today demonstrates NSR with HR 79, early r-wave progression. No ischemia or block.  Recent Labs: 06/03/2020: ALT 20; BUN 17; Creatinine, Ser 1.15; Hemoglobin 13.9; Platelets 209; Potassium 3.7; Sodium 141  Recent Lipid Panel    Component Value Date/Time   CHOL 139 06/03/2020 0946   TRIG 53 06/03/2020 0946   HDL 69 06/03/2020 0946   LDLCALC 58 06/03/2020 0946     Risk Assessment/Calculations:       Physical Exam:    VS:  BP 122/68   Pulse 79   Ht 5' 10"  (1.778 m)   Wt 156 lb (70.8 kg)   SpO2 98%   BMI 22.38 kg/m     Wt Readings from Last 3 Encounters:  07/18/20 156 lb (70.8 kg)  06/11/20 157 lb 6.4 oz (71.4 kg)  12/17/19 155 lb (70.3 kg)     GEN:  Well nourished, well developed in no acute distress HEENT: Normal NECK: No JVD; No carotid bruits CARDIAC: RRR, no murmurs, rubs, gallops RESPIRATORY:  Clear to auscultation without rales, wheezing or rhonchi  ABDOMEN: Soft, non-tender,  non-distended MUSCULOSKELETAL:  No edema; No deformity  SKIN: Warm and dry NEUROLOGIC:  Alert and oriented x 3 PSYCHIATRIC:  Normal affect   ASSESSMENT:    1. Precordial pain   2. Dyspnea on exertion    PLAN:    In order of problems listed above:  #Ches pain on Exertion #DOE: Patient has noted increased dyspnea on exertion with associated chest pressure with exercise. Specifically, he is an avid runner and noticed now that he either needs to slow his pace or take frequent breaks to catch his breath and relieve his chest discomfort. This has been ongoing for a couple of years but has persisted despite continued conditioning. No symptoms at rest. Family history notable  for premature CAD in his father and possibly paternal grandfather. -Check coronary CTA -Check TTE -Start ASA 14m daily -Patient would like to defer statin until coronary CTA findings     Medication Adjustments/Labs and Tests Ordered: Current medicines are reviewed at length with the patient today.  Concerns regarding medicines are outlined above.  Orders Placed This Encounter  Procedures  . CT CORONARY MORPH W/CTA COR W/SCORE W/CA W/CM &/OR WO/CM  . CT CORONARY FRACTIONAL FLOW RESERVE DATA PREP  . CT CORONARY FRACTIONAL FLOW RESERVE FLUID ANALYSIS  . Basic metabolic panel  . EKG 12-Lead  . ECHOCARDIOGRAM COMPLETE   Meds ordered this encounter  Medications  . aspirin EC 81 MG tablet    Sig: Take 1 tablet (81 mg total) by mouth daily. Swallow whole.    Dispense:  90 tablet    Refill:  3  . metoprolol tartrate (LOPRESSOR) 100 MG tablet    Sig: Take one tablet by mouth 2 hours prior to CT    Dispense:  1 tablet    Refill:  0    Patient Instructions  Medication Instructions:  1) START Aspirin 878monce daily  *If you need a refill on your cardiac medications before your next appointment, please call your pharmacy*   Lab Work: BMET today  If you have labs (blood work) drawn today and your tests are  completely normal, you will receive your results only by: . Marland KitchenyChart Message (if you have MyChart) OR . A paper copy in the mail If you have any lab test that is abnormal or we need to change your treatment, we will call you to review the results.   Testing/Procedures: Your physician has requested that you have an echocardiogram. Echocardiography is a painless test that uses sound waves to create images of your heart. It provides your doctor with information about the size and shape of your heart and how well your heart's chambers and valves are working. This procedure takes approximately one hour. There are no restrictions for this procedure.  Your physician recommends that you have a Coronary CT performed.   Follow-Up: [facility].  As part of our continuing mission to provide you with exceptional heart care, we have created designated Provider Care Teams.  These Care Teams include your primary Cardiologist (physician) and Advanced Practice Providers (APPs -  Physician Assistants and Nurse Practitioners) who all work together to provide you with the care you need, when you need it.  We recommend signing up for the patient portal called "MyChart".  Sign up information is provided on this After Visit Summary.  MyChart is used to connect with patients for Virtual Visits (Telemedicine).  Patients are able to view lab/test results, encounter notes, upcoming appointments, etc.  Non-urgent messages can be sent to your provider as well.   To learn more about what you can do with MyChart, go to [email or web address]   Your next appointment:   6 month(s)  The format for your next appointment:   In Person  Provider:   You may see HeGwyndolyn KaufmanMD or one of the following Advanced Practice Providers on your designated Care Team:    ScRichardson DoppPA-C  Vin Bh[place]  Other Instructions  Your cardiac CT will be scheduled at one of  the below locations:   [facility] [id number]) [id number]  [address] [place], [place] [address] [phone]  If scheduled at [facility], please arrive at the [facility] main entrance (entrance A) of [facility] 30 minutes prior to test start time. Proceed to the [facility] Radiology Department (first floor) to check-in and test prep.  If scheduled at [facility], please arrive 15 mins early for check-in and test prep.  Please follow these instructions carefully (unless otherwise directed):  Hold all erectile dysfunction medications at least 3 days (72 hrs) prior to test.  On the Night Before the Test: . Be sure to Drink plenty of water. . Do not consume any caffeinated/decaffeinated beverages or chocolate 12 hours prior to your test. . Do not take any antihistamines 12 hours prior to your test.  On the Day of the Test: . Drink plenty of water until 1 hour prior to the test. . Do not eat any food 4 hours prior to the test. . You may take your regular medications prior to the test.  . Take metoprolol (Lopressor) two hours prior to test. . HOLD Furosemide/Hydrochlorothiazide morning of the test.       After the Test: . Drink plenty of water. . After receiving IV contrast, you may experience a mild flushed feeling. This is normal. . On occasion, you may experience a mild rash up to 24 hours after the test. This is not dangerous. If this occurs, you can take Benadryl 25 mg and increase your fluid intake. . If you experience trouble breathing, this can be serious. If it is severe call 911 IMMEDIATELY. If it is mild, please call our office. . If you take any of these medications: Glipizide/Metformin, Avandament, Glucavance, please do not take 48 hours after completing test unless otherwise instructed.   Once we have confirmed  authorization from your insurance company, we will call you to set up a date and time for your test. Based on how quickly your insurance processes prior authorizations requests, please allow up to 4 weeks to be contacted for scheduling your Cardiac CT appointment. Be advised that routine Cardiac CT appointments could be scheduled as many as 8 weeks after your provider has ordered it.  For non-scheduling related questions, please contact the cardiac imaging nurse navigator should you have any questions/concerns: Marchia Bond, Cardiac Imaging Nurse Navigator Gordy Clement, Cardiac Imaging Nurse Navigator [facility] Direct Office Dial: [phone]   For scheduling needs, including cancellations and rescheduling, please call [place], [phone].       Signed, Freada Bergeron, MD  07/18/2020 3:25 PM    [place]

## 2020-07-16 NOTE — Telephone Encounter (Signed)
[facility] is [facility] to fill pt wellbutrin. Please advise [facility]

## 2020-07-17 ENCOUNTER — Other Ambulatory Visit: Payer: Self-pay | Admitting: Family Medicine

## 2020-07-17 NOTE — Telephone Encounter (Signed)
[facility] is [facility] to fill pt wellbutrin. Please advise [facility]

## 2020-07-18 ENCOUNTER — Other Ambulatory Visit: Payer: Self-pay | Admitting: Family Medicine

## 2020-07-18 ENCOUNTER — Ambulatory Visit: Payer: Managed Care, Other (non HMO) | Admitting: Cardiology

## 2020-07-18 ENCOUNTER — Other Ambulatory Visit: Payer: Self-pay

## 2020-07-18 ENCOUNTER — Encounter: Payer: Self-pay | Admitting: Cardiology

## 2020-07-18 VITALS — BP 122/68 | HR 79 | Ht 70.0 in | Wt 156.0 lb

## 2020-07-18 DIAGNOSIS — R072 Precordial pain: Secondary | ICD-10-CM

## 2020-07-18 DIAGNOSIS — R0600 Dyspnea, unspecified: Secondary | ICD-10-CM

## 2020-07-18 DIAGNOSIS — R0609 Other forms of dyspnea: Secondary | ICD-10-CM

## 2020-07-18 MED ORDER — METOPROLOL TARTRATE 100 MG PO TABS: ORAL_TABLET | ORAL | 0 refills | Status: DC

## 2020-07-18 MED ORDER — ASPIRIN EC 81 MG PO TBEC: 81.0000 mg | DELAYED_RELEASE_TABLET | Freq: Every day | ORAL | 3 refills | Status: DC

## 2020-07-18 NOTE — Patient Instructions (Signed)
Medication Instructions:  1) START Aspirin 53m once daily  *If you need a refill on your cardiac medications before your next appointment, please call your pharmacy*   Lab Work: BMET today  If you have labs (blood work) drawn today and your tests are completely normal, you will receive your results only by: .Marland KitchenMyChart Message (if you have MyChart) OR . A paper copy in the mail If you have any lab test that is abnormal or we need to change your treatment, we will call you to review the results.   Testing/Procedures: Your physician has requested that you have an echocardiogram. Echocardiography is a painless test that uses sound waves to create images of your heart. It provides your doctor with information about the size and shape of your heart and how well your heart's chambers and valves are working. This procedure takes approximately one hour. There are no restrictions for this procedure.  Your physician recommends that you have a Coronary CT performed.   Follow-Up: [facility] our [facility].  As part of our continuing mission to provide you with exceptional heart care, we have created designated Provider Care Teams.  These Care Teams include your primary Cardiologist (physician) and Advanced Practice Providers (APPs -  Physician Assistants and Nurse Practitioners) who all work together to provide you with the care you need, when you need it.  We recommend signing up for the patient portal called "MyChart".  Sign up information is provided on this After Visit Summary.  MyChart is used to connect with patients for Virtual Visits (Telemedicine).  Patients are able to view lab/test results, encounter notes, upcoming appointments, etc.  Non-urgent messages can be sent to your provider as well.   To learn more about what you can do with MyChart, go to [email or web address]    Your next appointment:   6 month(s)  The format for your next appointment:    In Person  Provider:   You may see HGwyndolyn Kaufman MD or one of the following Advanced Practice Providers on your designated Care Team:    SRichardson Dopp PA-C  Vin BLaurel Heights [place]   Other Instructions  Your cardiac CT will be scheduled at one of the below locations:   [facility] [place] [phone] [place] [phone] [facility] please arrive at the [facility] entrance (entrance A) of [facility] minutes prior to test start time. Proceed to the [facility] Department (first floor) to check-in and test prep.  [facility] please arrive 15 mins early for check-in and test prep.  Please follow these instructions carefully (unless otherwise directed):  Hold all erectile dysfunction medications at least 3 days (72 hrs) prior to test.  On the Night Before the Test: . Be sure to Drink plenty of water. . Do not consume any caffeinated/decaffeinated beverages or chocolate 12 hours prior to your test. . Do not take any antihistamines 12 hours prior to your test.  On the Day of the Test: . Drink plenty of water until 1 hour prior to the test. . Do not eat any food 4 hours prior to the test. . You may take your regular medications prior to the test.  . Take metoprolol (Lopressor) two hours prior to test. . HOLD Furosemide/Hydrochlorothiazide  morning of the test.       After the Test: . Drink plenty of water. . After receiving IV contrast, you may experience a mild flushed feeling. This is normal. . On occasion, you may experience a mild rash up to 24 hours after the test. This is not dangerous. If this occurs, you can take Benadryl 25 mg and increase your fluid intake. . If you experience trouble breathing, this can be serious. If it is  severe call 911 IMMEDIATELY. If it is mild, please call our office. . If you take any of these medications: Glipizide/Metformin, Avandament, Glucavance, please do not take 48 hours after completing test unless otherwise instructed.   Once we have confirmed authorization from your insurance company, we will call you to set up a date and time for your test. Based on how quickly your insurance processes prior authorizations requests, please allow up to 4 weeks to be contacted for scheduling your Cardiac CT appointment. Be advised that routine Cardiac CT appointments could be scheduled as many as 8 weeks after your provider has ordered it.  For non-scheduling related questions, please contact the cardiac imaging nurse navigator should you have any questions/concerns: Marchia Bond, Cardiac Imaging Nurse Navigator Gordy Clement, Cardiac Imaging Nurse Navigator [place] [facility] Direct Office Dial: [phone]   For scheduling needs, including cancellations and rescheduling, please call [place], [phone].

## 2020-07-18 NOTE — Telephone Encounter (Signed)
[facility] is requesting to fill pt wellbutrin. May ave been sent or denied already. [place]

## 2020-07-19 LAB — BASIC METABOLIC PANEL
BUN/Creatinine Ratio: 13 (ref 9–20)
BUN: 14 mg/dL (ref 6–24)
CO2: 23 mmol/L (ref 20–29)
Calcium: 9.7 mg/dL (ref 8.7–10.2)
Chloride: 101 mmol/L (ref 96–106)
Creatinine, Ser: 1.12 mg/dL (ref 0.76–1.27)
GFR calc Af Amer: 83 mL/min/{1.73_m2} (ref >59)
GFR calc non Af Amer: 72 mL/min/{1.73_m2} (ref >59)
Glucose: 87 mg/dL (ref 65–99)
Potassium: 3.8 mmol/L (ref 3.5–5.2)
Sodium: 143 mmol/L (ref 134–144)

## 2020-07-19 NOTE — Telephone Encounter (Signed)
[facility] is requesting to fill pt wellbutrin for a year. Does not appear he is taking this. Please advise [facility]

## 2020-07-30 ENCOUNTER — Other Ambulatory Visit: Payer: Self-pay | Admitting: Family Medicine

## 2020-07-31 ENCOUNTER — Other Ambulatory Visit: Payer: Self-pay | Admitting: Family Medicine

## 2020-07-31 NOTE — Telephone Encounter (Signed)
[facility] is requesting to fill pt wellbutrin. Please advise [facility]

## 2020-08-01 NOTE — Telephone Encounter (Signed)
[facility] is requesting to fill pt wellbuitrin . Looks as if this was d/c . Please advise [facility]

## 2020-08-01 NOTE — Telephone Encounter (Signed)
They keep sending me this and I keep saying do not fill antibiotic.  Call them up until of this off

## 2020-08-02 ENCOUNTER — Other Ambulatory Visit: Payer: Self-pay | Admitting: Family Medicine

## 2020-08-05 ENCOUNTER — Other Ambulatory Visit: Payer: Self-pay | Admitting: Family Medicine

## 2020-08-05 DIAGNOSIS — F902 Attention-deficit hyperactivity disorder, combined type: Secondary | ICD-10-CM

## 2020-08-05 MED ORDER — AMPHETAMINE-DEXTROAMPHETAMINE 10 MG PO TABS: 10.0000 mg | ORAL_TABLET | Freq: Two times a day (BID) | ORAL | 0 refills | Status: DC

## 2020-08-05 NOTE — Telephone Encounter (Signed)
[facility] is [facility] to fill pt welbutrin. Looks like it was d/c. Please advise [facility]

## 2020-08-05 NOTE — Telephone Encounter (Signed)
You will have to check with the patient.  Looks like maybe someone from the cardiologist's office reviewing meds is the one that marked it as cancelled due to change in therapy. I don't see any mention of this in Dr. Lanice Shirts notes.  You'll have to ask the patient if this was stopped, or if it is still needed.

## 2020-08-05 NOTE — Telephone Encounter (Signed)
I saw the message you sent to patient.  If he isn't noticing improvement with this refill, he needs to set up an appointment with Dr. Redmond School when he returns

## 2020-08-05 NOTE — Telephone Encounter (Signed)
[facility] is requesting to fill pt adderall. Please advise [facility] 

## 2020-08-06 ENCOUNTER — Other Ambulatory Visit: Payer: Self-pay

## 2020-08-06 ENCOUNTER — Ambulatory Visit (HOSPITAL_COMMUNITY): Payer: Managed Care, Other (non HMO) | Attending: Cardiovascular Disease

## 2020-08-06 DIAGNOSIS — R072 Precordial pain: Secondary | ICD-10-CM | POA: Diagnosis present

## 2020-08-06 LAB — ECHOCARDIOGRAM COMPLETE
Area-P 1/2: 2.39 cm2
P 1/2 time: 546 msec
S' Lateral: 3.6 cm

## 2020-08-06 NOTE — Telephone Encounter (Signed)
FYI it looks like they sent requests on 3/1 and 3/2 which were forwarded to [facility], who advised to refuse and contact the pharmacy so they stop requesting it from [place].  Please do not send further wellbutrin refill requests back.

## 2020-08-07 NOTE — Telephone Encounter (Signed)
Called [facility] t/w pharmacist Aaron Edelman [phone] and he apologized that there is a problem with the system.  He realized we keep denying and he will have this medication removed from the patients list.

## 2020-08-15 ENCOUNTER — Telehealth: Payer: Self-pay | Admitting: Cardiology

## 2020-08-15 NOTE — Telephone Encounter (Signed)
The patient has been notified of the result and verbalized understanding.  All questions (if any) were answered. Precious Gilding, RN 08/15/2020 8:46 AM

## 2020-08-15 NOTE — Telephone Encounter (Signed)
Patient returning call for echo results. 

## 2020-08-22 ENCOUNTER — Telehealth (HOSPITAL_COMMUNITY): Payer: Self-pay | Admitting: *Deleted

## 2020-08-22 ENCOUNTER — Other Ambulatory Visit (HOSPITAL_COMMUNITY): Payer: Self-pay | Admitting: *Deleted

## 2020-08-22 MED ORDER — METOPROLOL TARTRATE 100 MG PO TABS: ORAL_TABLET | ORAL | 0 refills | Status: DC

## 2020-08-22 NOTE — Telephone Encounter (Signed)
Reaching out to patient to offer assistance regarding upcoming cardiac imaging study; pt verbalizes understanding of appt date/time, parking situation and where to check in, pre-test NPO status and medications ordered, and verified current allergies; name and call back number provided for further questions should they arise  Gordy Clement RN [facility] [phone] office [phone] cell  Pt stated he took his metoprolol and another medication was sent to pt's verified pharmacy.

## 2020-08-26 ENCOUNTER — Other Ambulatory Visit: Payer: Self-pay

## 2020-08-26 ENCOUNTER — Telehealth: Payer: Self-pay | Admitting: Cardiology

## 2020-08-26 ENCOUNTER — Encounter (HOSPITAL_COMMUNITY): Payer: Self-pay

## 2020-08-26 ENCOUNTER — Encounter: Payer: Managed Care, Other (non HMO) | Admitting: *Deleted

## 2020-08-26 ENCOUNTER — Ambulatory Visit (HOSPITAL_COMMUNITY)
Admission: RE | Admit: 2020-08-26 | Discharge: 2020-08-26 | Disposition: A | Discharge status: Home / Self Care | Payer: Managed Care, Other (non HMO) | Source: Ambulatory Visit | Attending: Cardiology | Admitting: Cardiology

## 2020-08-26 DIAGNOSIS — R072 Precordial pain: Secondary | ICD-10-CM | POA: Insufficient documentation

## 2020-08-26 DIAGNOSIS — Z006 Encounter for examination for normal comparison and control in clinical research program: Secondary | ICD-10-CM

## 2020-08-26 MED ORDER — IOHEXOL 350 MG/ML SOLN
80.0000 mL | Freq: Once | INTRAVENOUS | Status: AC | PRN
  Administered 2020-08-26: 80 mL via INTRAVENOUS

## 2020-08-26 MED ORDER — NITROGLYCERIN 0.4 MG SL SUBL
SUBLINGUAL_TABLET | SUBLINGUAL | Status: AC
  Filled 2020-08-26: qty 1

## 2020-08-26 MED ORDER — NITROGLYCERIN 0.4 MG SL SUBL
0.8000 mg | SUBLINGUAL_TABLET | Freq: Once | SUBLINGUAL | Status: AC
  Administered 2020-08-26: 0.4 mg via SUBLINGUAL

## 2020-08-26 NOTE — Telephone Encounter (Signed)
RN reported noted finding to Dr. Johney Frame.  This is not critical and will be followed yearly by Dr. Johney Frame, per Dr. Johney Frame.

## 2020-08-26 NOTE — Telephone Encounter (Signed)
New message:     From Radiologists to report stat

## 2020-08-26 NOTE — Telephone Encounter (Signed)
Spoke with Pintin with [facility] in regards to critical CT morphology report.  I will route to covering nurse and MD as high priorty and verbally speak with RN.

## 2020-08-26 NOTE — Research (Signed)
Subject Name: Cory [name]  Subject met inclusion and exclusion criteria.  The informed consent form, study requirements and expectations were reviewed with the subject and questions and concerns were addressed prior to the signing of the consent form.  The subject verbalized understanding of the trial requirements.  The subject agreed to participate in the IDENTIFY trial and signed the informed consent at [place] on 08/26/20  The informed consent was obtained prior to performance of any protocol-specific procedures for the subject.  A copy of the signed informed consent was given to the subject and a copy was placed in the subject's medical record.   [name]

## 2020-09-03 ENCOUNTER — Other Ambulatory Visit: Payer: Self-pay | Admitting: Family Medicine

## 2020-09-03 DIAGNOSIS — F902 Attention-deficit hyperactivity disorder, combined type: Secondary | ICD-10-CM

## 2020-09-03 MED ORDER — AMPHETAMINE-DEXTROAMPHETAMINE 10 MG PO TABS: 10.0000 mg | ORAL_TABLET | Freq: Two times a day (BID) | ORAL | 0 refills | Status: DC

## 2020-09-03 NOTE — Telephone Encounter (Signed)
[facility] is requesting to fill pt adderrall. Please advise [facility]

## 2020-10-07 ENCOUNTER — Other Ambulatory Visit: Payer: Self-pay | Admitting: Family Medicine

## 2020-10-07 ENCOUNTER — Encounter: Payer: Self-pay | Admitting: Family Medicine

## 2020-10-07 MED ORDER — AMPHETAMINE-DEXTROAMPHETAMINE 30 MG PO TABS: 30.0000 mg | ORAL_TABLET | Freq: Every day | ORAL | 0 refills | Status: DC

## 2020-11-06 ENCOUNTER — Other Ambulatory Visit: Payer: Self-pay | Admitting: Family Medicine

## 2020-11-06 MED ORDER — AMPHETAMINE-DEXTROAMPHETAMINE 30 MG PO TABS: 30.0000 mg | ORAL_TABLET | Freq: Every day | ORAL | 0 refills | Status: DC

## 2020-11-06 NOTE — Telephone Encounter (Signed)
Ok to refill 

## 2020-12-09 ENCOUNTER — Other Ambulatory Visit: Payer: Self-pay | Admitting: Family Medicine

## 2020-12-09 MED ORDER — AMPHETAMINE-DEXTROAMPHETAMINE 30 MG PO TABS: 30.0000 mg | ORAL_TABLET | Freq: Every day | ORAL | 0 refills | Status: DC

## 2020-12-24 ENCOUNTER — Telehealth: Payer: Self-pay

## 2020-12-24 DIAGNOSIS — Z006 Encounter for examination for normal comparison and control in clinical research program: Secondary | ICD-10-CM

## 2020-12-24 NOTE — Telephone Encounter (Signed)
Called patient for 90 day Identify phone call no answer, I left a voicemail stating the intent of the phone call and our call back number to be reached in our department. 

## 2020-12-30 ENCOUNTER — Telehealth: Payer: Self-pay

## 2020-12-30 DIAGNOSIS — Z006 Encounter for examination for normal comparison and control in clinical research program: Secondary | ICD-10-CM

## 2020-12-30 NOTE — Telephone Encounter (Signed)
I called patient for his 90-day Identify Study follow up phone call. Patient is doing well with no cardiac symptoms at this time. I reminded patient I would call him in March for his 1 year follow-up.

## 2021-01-13 ENCOUNTER — Encounter: Payer: Self-pay | Admitting: Family Medicine

## 2021-01-13 MED ORDER — AMPHETAMINE-DEXTROAMPHETAMINE 30 MG PO TABS: 30.0000 mg | ORAL_TABLET | Freq: Every day | ORAL | 0 refills | Status: DC

## 2021-04-15 ENCOUNTER — Encounter: Payer: Self-pay | Admitting: Family Medicine

## 2021-04-15 ENCOUNTER — Other Ambulatory Visit: Payer: Self-pay

## 2021-04-15 ENCOUNTER — Ambulatory Visit: Payer: Managed Care, Other (non HMO) | Admitting: Family Medicine

## 2021-04-15 VITALS — BP 108/68 | HR 67 | Temp 97.3°F | Wt 157.6 lb

## 2021-04-15 DIAGNOSIS — S6992XA Unspecified injury of left wrist, hand and finger(s), initial encounter: Secondary | ICD-10-CM | POA: Diagnosis not present

## 2021-04-15 DIAGNOSIS — M79672 Pain in left foot: Secondary | ICD-10-CM

## 2021-04-15 NOTE — Progress Notes (Signed)
   Subjective:    Patient ID: Cory [name], male    DOB: [birth year], 60 y.o.   MRN: [id number]  HPI He is here for consult concerning several issues.  He injured his left fourth finger causing the tip of his fourth finger to Hyperflex at the DIP joint.  He is now unable to totally straighten his finger out.  He also states that he has had difficulty over the last several years with left great toe discomfort.  He gives remote history of apparently trauma to this area and he is concerned about arthritis in it.  He also plans to run in a marathon and sprain and wants to minimize any pain from this.  He then described bilateral foot pain/tingling sensation over the entire plantar surface of both feet.  It bothers him at night sometimes interfering with sleep and can also cause difficulty in the morning when he wakes up but does tend to go away as the morning progresses.  He specifically said it was not over the heel but over the entire bottom of the foot.   Review of Systems     Objective:   Physical Exam Exam of the left fourth finger does show inability to fully extend at the DIP joint.  Full flexion.  No tenderness palpation. Exam of the left foot does show some hypertrophy of the DIP joint but no tenderness palpation.  The rest exam shows no skin changes with normal sensation.       Assessment & Plan:  Left foot pain   2 issues going on here.  1 is probable arthritis to the DIP.  Not sure what is going on causing the tingling burning sensation over the entire plantar surface of the foot.  Since he is trying to run a marathon this spring I think input from Dr. Oneida Alar would be worthwhile to help him get through that marathon.  Finger injury, left, initial encounter: I explained that from a functional position, slight flexion there is not a big problem but I will give him a splint to see if we can straighten it out for aesthetic purposes.

## 2021-06-10 ENCOUNTER — Ambulatory Visit: Payer: Managed Care, Other (non HMO) | Admitting: Sports Medicine

## 2021-06-10 VITALS — BP 110/72 | Ht 70.0 in | Wt 155.0 lb

## 2021-06-10 DIAGNOSIS — M2022 Hallux rigidus, left foot: Secondary | ICD-10-CM | POA: Diagnosis not present

## 2021-06-10 DIAGNOSIS — M722 Plantar fascial fibromatosis: Secondary | ICD-10-CM | POA: Diagnosis not present

## 2021-06-10 DIAGNOSIS — M2141 Flat foot [pes planus] (acquired), right foot: Secondary | ICD-10-CM

## 2021-06-10 DIAGNOSIS — M2142 Flat foot [pes planus] (acquired), left foot: Secondary | ICD-10-CM

## 2021-06-10 DIAGNOSIS — M19079 Primary osteoarthritis, unspecified ankle and foot: Secondary | ICD-10-CM
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
## 2021-06-12 ENCOUNTER — Encounter: Payer: Self-pay | Admitting: Family Medicine

## 2021-06-12 ENCOUNTER — Other Ambulatory Visit: Payer: Self-pay

## 2021-06-12 ENCOUNTER — Ambulatory Visit: Payer: Managed Care, Other (non HMO) | Admitting: Family Medicine

## 2021-06-12 VITALS — BP 92/60 | HR 59 | Temp 97.4°F | Ht 69.5 in | Wt 149.6 lb

## 2021-06-12 DIAGNOSIS — F902 Attention-deficit hyperactivity disorder, combined type: Secondary | ICD-10-CM

## 2021-06-12 DIAGNOSIS — M1990 Unspecified osteoarthritis, unspecified site: Secondary | ICD-10-CM

## 2021-06-12 DIAGNOSIS — K5000 Crohn's disease of small intestine without complications: Secondary | ICD-10-CM

## 2021-06-12 DIAGNOSIS — J301 Allergic rhinitis due to pollen: Secondary | ICD-10-CM

## 2021-06-12 DIAGNOSIS — Z23 Encounter for immunization: Secondary | ICD-10-CM

## 2021-06-12 DIAGNOSIS — Z0000 Encounter for general adult medical examination without abnormal findings: Secondary | ICD-10-CM

## 2021-06-12 DIAGNOSIS — Z1321 Encounter for screening for nutritional disorder: Secondary | ICD-10-CM

## 2021-06-12 DIAGNOSIS — Z87442 Personal history of urinary calculi: Secondary | ICD-10-CM

## 2021-06-12 DIAGNOSIS — Z8249 Family history of ischemic heart disease and other diseases of the circulatory system: Secondary | ICD-10-CM

## 2021-06-12 DIAGNOSIS — M25511 Pain in right shoulder: Secondary | ICD-10-CM

## 2021-06-12 DIAGNOSIS — K4090 Unilateral inguinal hernia, without obstruction or gangrene, not specified as recurrent: Secondary | ICD-10-CM | POA: Insufficient documentation

## 2021-06-12 DIAGNOSIS — K219 Gastro-esophageal reflux disease without esophagitis: Secondary | ICD-10-CM | POA: Insufficient documentation

## 2021-06-12 DIAGNOSIS — F341 Dysthymic disorder: Secondary | ICD-10-CM

## 2021-06-12 NOTE — Progress Notes (Signed)
Subjective:    Patient ID: Cory [name], male    DOB: 1960-09-11, 61 y.o.   MRN: [id number]  HPI He is here for complete examination.  He was recently seen and evaluated for his foot issues.  He is preparing for marathon.  He was given orthotics which he states he thinks is helping.  He also notes some right shoulder discomfort and would like this further evaluated.  No history of injury or overuse.  He also notes of slight swelling in the right inguinal area that is concerned about a hernia.  He does have underlying ADD but is presently not taking any medication.  It apparently did initially help but then wore off.  He is handling this by cutting back on his caffeine which has helped.  He does have a questionable history of Crohn's disease but is handling this essentially by cutting down on his carbs as well as avoiding gluten foods.  He has been seen in the past by Dr. Ellender Hose for this.  He also complains of some reflux symptoms that are easily handled with an occasional use of Prilosec.  He is on no medications other than OTC meds.  There is a family history for heart disease.  His allergies does seem to be under good control.  Work and home life are going well.  His children are all in their 42s.  Family and social history as well as health maintenance and immunizations was reviewed.   Review of Systems  All other systems reviewed and are negative.     Objective:   Physical Exam Alert and in no distress. Tympanic membranes and canals are normal. Pharyngeal area is normal. Neck is supple without adenopathy or thyromegaly. Cardiac exam shows a regular sinus rhythm without murmurs or gallops. Lungs are clear to auscultation.  Abdominal exam does show a hernia on the right.  Testes normal. Right shoulder exam does show some slight discomfort with supraspinatus testing.  No laxity noted.  Negative drop arm test but uncomfortable.  Neer's and Hawkins test negative.       Assessment & Plan:   Routine general medical examination at a health care facility - Plan: CBC with Differential/Platelet, Comprehensive metabolic panel, Lipid panel  Seasonal allergic rhinitis due to pollen  Crohn's disease of small intestine without complication (HCC)  Arthritis  Attention deficit hyperactivity disorder (ADHD), combined type  Dysthymia  Family history of ischemic heart disease  History of renal stone  Gilbert's disease  Encounter for vitamin deficiency screening - Plan: Vitamin B12, VITAMIN D 25 Hydroxy (Vit-D Deficiency, Fractures)  Immunization, viral disease - Plan: Ambulance person Booster  Need for influenza vaccination - Plan: Flu Vaccine QUAD 81moIM (Fluarix, Fluzone & Alfiuria Quad PF)  Right shoulder pain, unspecified chronicity  Gastroesophageal reflux disease, unspecified whether esophagitis present  Right inguinal hernia No therapy needed for history of stone, Gilbert's disease or the dysthymia.  He seems to doing quite nicely psychologically. He seems to be handling his GI issues fairly well.  There is a question of Crohn's disease but he also has symptoms of sounds as if it could be gluten issues.  He seems to be avoiding gluten as well as sodas which I think is very reasonable. I discussed the right shoulder pain with him and at this time since he is not having any problems seems really not interested in pursuing PT. Continue on Prilosec on an as-needed basis. Discussed the hernia with him and especially since he  is preparing for marathon nothing further needs to be done.  We can refer him at any point since its really only minor issue at the present time.  He was comfortable with that. His lipid panel from before was quite excellent.

## 2021-06-13 LAB — CBC WITH DIFFERENTIAL/PLATELET
Basophils Absolute: 0 10*3/uL (ref 0.0–0.2)
Basos: 1 %
EOS (ABSOLUTE): 0.1 10*3/uL (ref 0.0–0.4)
Eos: 1 %
Hematocrit: 40.4 % (ref 37.5–51.0)
Hemoglobin: 13.6 g/dL (ref 13.0–17.7)
Immature Grans (Abs): 0 10*3/uL (ref 0.0–0.1)
Immature Granulocytes: 0 %
Lymphocytes Absolute: 1.3 10*3/uL (ref 0.7–3.1)
Lymphs: 27 %
MCH: 32.1 pg (ref 26.6–33.0)
MCHC: 33.7 g/dL (ref 31.5–35.7)
MCV: 95 fL (ref 79–97)
Monocytes Absolute: 0.4 10*3/uL (ref 0.1–0.9)
Monocytes: 8 %
Neutrophils Absolute: 3.2 10*3/uL (ref 1.4–7.0)
Neutrophils: 63 %
Platelets: 219 10*3/uL (ref 150–450)
RBC: 4.24 x10E6/uL (ref 4.14–5.80)
RDW: 12.4 % (ref 11.6–15.4)
WBC: 5 10*3/uL (ref 3.4–10.8)

## 2021-06-13 LAB — LIPID PANEL
Chol/HDL Ratio: 1.8 ratio (ref 0.0–5.0)
Cholesterol, Total: 135 mg/dL (ref 100–199)
HDL: 73 mg/dL (ref >39)
LDL Chol Calc (NIH): 41 mg/dL (ref 0–99)
Triglycerides: 119 mg/dL (ref 0–149)
VLDL Cholesterol Cal: 21 mg/dL (ref 5–40)

## 2021-06-13 LAB — COMPREHENSIVE METABOLIC PANEL
ALT: 27 IU/L (ref 0–44)
AST: 45 IU/L — ABNORMAL HIGH (ref 0–40)
Albumin/Globulin Ratio: 2.6 — ABNORMAL HIGH (ref 1.2–2.2)
Albumin: 4.7 g/dL (ref 3.8–4.9)
Alkaline Phosphatase: 74 IU/L (ref 44–121)
BUN/Creatinine Ratio: 16 (ref 9–20)
BUN: 18 mg/dL (ref 6–24)
Bilirubin Total: 2.1 mg/dL — ABNORMAL HIGH (ref 0.0–1.2)
CO2: 25 mmol/L (ref 20–29)
Calcium: 9.2 mg/dL (ref 8.7–10.2)
Chloride: 100 mmol/L (ref 96–106)
Creatinine, Ser: 1.1 mg/dL (ref 0.76–1.27)
Globulin, Total: 1.8 g/dL (ref 1.5–4.5)
Glucose: 82 mg/dL (ref 70–99)
Potassium: 4.3 mmol/L (ref 3.5–5.2)
Sodium: 142 mmol/L (ref 134–144)
Total Protein: 6.5 g/dL (ref 6.0–8.5)
eGFR: 77 mL/min/{1.73_m2} (ref >59)

## 2021-06-13 LAB — VITAMIN B12: Vitamin B-12: 115 pg/mL — ABNORMAL LOW (ref 232–1245)

## 2021-06-13 LAB — VITAMIN D 25 HYDROXY (VIT D DEFICIENCY, FRACTURES): Vit D, 25-Hydroxy: 38.6 ng/mL (ref 30.0–100.0)

## 2021-06-16 DIAGNOSIS — E538 Deficiency of other specified B group vitamins: Secondary | ICD-10-CM | POA: Insufficient documentation

## 2021-07-01 ENCOUNTER — Other Ambulatory Visit: Payer: Self-pay | Admitting: Family Medicine

## 2021-07-02 LAB — VITAMIN B12: Vitamin B-12: 84 pg/mL — ABNORMAL LOW (ref 232–1245)

## 2021-12-11 ENCOUNTER — Encounter: Payer: Self-pay | Admitting: Family Medicine

## 2022-01-06 ENCOUNTER — Ambulatory Visit: Payer: Managed Care, Other (non HMO) | Admitting: Family Medicine

## 2022-01-06 ENCOUNTER — Encounter: Payer: Self-pay | Admitting: Family Medicine

## 2022-01-06 VITALS — BP 120/74 | HR 70 | Temp 97.2°F | Wt 156.2 lb

## 2022-01-06 DIAGNOSIS — F902 Attention-deficit hyperactivity disorder, combined type: Secondary | ICD-10-CM | POA: Diagnosis not present

## 2022-01-06 DIAGNOSIS — Z23 Encounter for immunization: Secondary | ICD-10-CM

## 2022-01-06 DIAGNOSIS — E538 Deficiency of other specified B group vitamins: Secondary | ICD-10-CM | POA: Diagnosis not present

## 2022-01-06 DIAGNOSIS — K5000 Crohn's disease of small intestine without complications: Secondary | ICD-10-CM

## 2022-01-06 NOTE — Progress Notes (Signed)
   Subjective:    Patient ID: JEFFEREY LIPPMANN, male    DOB: 02/15/1961, 61 y.o.   MRN: [id number]  HPI He is here today to get a prescription to get a B12 level drawn.  He has been taking 2000 mcg of vitamin B12.  He does have a previous history of difficulty with B12 deficiency due to previous GI issues.  He does have Crohn's disease and also has had surgery as an infant for intussusception.  Review of the record also indicates he is now 21 and is eligible for Shingrix.  He plans to return here in January for complete exam.  He also has an underlying history of ADHD and did not tolerate standard ADD meds.  He seems to be doing fairly well on Wellbutrin and is comfortable with the present dosing regimen   Review of Systems     Objective:   Physical Exam Alert and in no distress otherwise not examined       Assessment & Plan:  Need for shingles vaccine - Plan: Varicella-zoster vaccine IM  Vitamin B12 deficiency  Crohn's disease of small intestine without complication (HCC)  Attention deficit hyperactivity disorder (ADHD), combined type

## 2022-01-07 ENCOUNTER — Other Ambulatory Visit: Payer: Self-pay | Admitting: Family Medicine

## 2022-01-08 LAB — VITAMIN B12: Vitamin B-12: 650 pg/mL (ref 232–1245)

## 2022-02-04 ENCOUNTER — Encounter: Payer: Self-pay | Admitting: Internal Medicine

## 2022-02-27 ENCOUNTER — Encounter: Payer: Self-pay | Admitting: Family Medicine

## 2022-03-23 ENCOUNTER — Encounter: Payer: Self-pay | Admitting: Internal Medicine

## 2022-06-12 ENCOUNTER — Encounter: Payer: Self-pay | Admitting: Family Medicine

## 2022-06-15 ENCOUNTER — Other Ambulatory Visit: Payer: Self-pay | Admitting: Family Medicine

## 2022-06-16 ENCOUNTER — Encounter: Payer: Self-pay | Admitting: Family Medicine

## 2022-06-16 ENCOUNTER — Ambulatory Visit: Payer: Managed Care, Other (non HMO) | Admitting: Family Medicine

## 2022-06-16 VITALS — BP 102/70 | HR 73 | Temp 97.9°F | Resp 14 | Ht 69.5 in | Wt 157.6 lb

## 2022-06-16 DIAGNOSIS — E559 Vitamin D deficiency, unspecified: Secondary | ICD-10-CM | POA: Insufficient documentation

## 2022-06-16 DIAGNOSIS — Z0000 Encounter for general adult medical examination without abnormal findings: Secondary | ICD-10-CM | POA: Diagnosis not present

## 2022-06-16 DIAGNOSIS — F902 Attention-deficit hyperactivity disorder, combined type: Secondary | ICD-10-CM

## 2022-06-16 DIAGNOSIS — Z23 Encounter for immunization: Secondary | ICD-10-CM | POA: Diagnosis not present

## 2022-06-16 DIAGNOSIS — M1990 Unspecified osteoarthritis, unspecified site: Secondary | ICD-10-CM

## 2022-06-16 DIAGNOSIS — K219 Gastro-esophageal reflux disease without esophagitis: Secondary | ICD-10-CM

## 2022-06-16 DIAGNOSIS — Z8249 Family history of ischemic heart disease and other diseases of the circulatory system: Secondary | ICD-10-CM

## 2022-06-16 DIAGNOSIS — F341 Dysthymic disorder: Secondary | ICD-10-CM

## 2022-06-16 DIAGNOSIS — J301 Allergic rhinitis due to pollen: Secondary | ICD-10-CM

## 2022-06-16 DIAGNOSIS — K5000 Crohn's disease of small intestine without complications: Secondary | ICD-10-CM

## 2022-06-16 DIAGNOSIS — E538 Deficiency of other specified B group vitamins: Secondary | ICD-10-CM

## 2022-06-16 LAB — CBC/DIFF AMBIGUOUS DEFAULT
Basophils Absolute: 0 10*3/uL (ref 0.0–0.2)
Basos: 1 %
EOS (ABSOLUTE): 0.1 10*3/uL (ref 0.0–0.4)
Eos: 2 %
Hematocrit: 42.6 % (ref 37.5–51.0)
Hemoglobin: 14.2 g/dL (ref 13.0–17.7)
Immature Grans (Abs): 0 10*3/uL (ref 0.0–0.1)
Immature Granulocytes: 0 %
Lymphocytes Absolute: 1.6 10*3/uL (ref 0.7–3.1)
Lymphs: 30 %
MCH: 31.3 pg (ref 26.6–33.0)
MCHC: 33.3 g/dL (ref 31.5–35.7)
MCV: 94 fL (ref 79–97)
Monocytes Absolute: 0.5 10*3/uL (ref 0.1–0.9)
Monocytes: 10 %
Neutrophils Absolute: 3 10*3/uL (ref 1.4–7.0)
Neutrophils: 57 %
Platelets: 238 10*3/uL (ref 150–450)
RBC: 4.53 x10E6/uL (ref 4.14–5.80)
RDW: 12.8 % (ref 11.6–15.4)
WBC: 5.3 10*3/uL (ref 3.4–10.8)

## 2022-06-16 LAB — POCT URINALYSIS DIP (PROADVANTAGE DEVICE)
Bilirubin, UA: NEGATIVE
Blood, UA: NEGATIVE
Glucose, UA: NEGATIVE mg/dL
Ketones, POC UA: NEGATIVE mg/dL
Leukocytes, UA: NEGATIVE
Nitrite, UA: NEGATIVE
Protein Ur, POC: NEGATIVE mg/dL
Specific Gravity, Urine: 1.025
Urobilinogen, Ur: 0.2
pH, UA: 6 (ref 5.0–8.0)

## 2022-06-16 LAB — COMPREHENSIVE METABOLIC PANEL
ALT: 15 IU/L (ref 0–44)
AST: 21 IU/L (ref 0–40)
Albumin/Globulin Ratio: 2.4 — ABNORMAL HIGH (ref 1.2–2.2)
Albumin: 4.7 g/dL (ref 3.8–4.9)
Alkaline Phosphatase: 82 IU/L (ref 44–121)
BUN/Creatinine Ratio: 10 (ref 10–24)
BUN: 11 mg/dL (ref 8–27)
Bilirubin Total: 2.1 mg/dL — ABNORMAL HIGH (ref 0.0–1.2)
CO2: 26 mmol/L (ref 20–29)
Calcium: 9.5 mg/dL (ref 8.6–10.2)
Chloride: 102 mmol/L (ref 96–106)
Creatinine, Ser: 1.05 mg/dL (ref 0.76–1.27)
Globulin, Total: 2 g/dL (ref 1.5–4.5)
Glucose: 83 mg/dL (ref 70–99)
Potassium: 4.9 mmol/L (ref 3.5–5.2)
Sodium: 141 mmol/L (ref 134–144)
Total Protein: 6.7 g/dL (ref 6.0–8.5)
eGFR: 81 mL/min/{1.73_m2} (ref >59)

## 2022-06-16 LAB — LIPID PANEL W/O CHOL/HDL RATIO
Cholesterol, Total: 146 mg/dL (ref 100–199)
HDL: 61 mg/dL (ref >39)
LDL Chol Calc (NIH): 38 mg/dL (ref 0–99)
Triglycerides: 319 mg/dL — ABNORMAL HIGH (ref 0–149)
VLDL Cholesterol Cal: 47 mg/dL — ABNORMAL HIGH (ref 5–40)

## 2022-06-16 LAB — SPECIMEN STATUS REPORT

## 2022-06-16 LAB — VITAMIN B12: Vitamin B-12: 421 pg/mL (ref 232–1245)

## 2022-06-16 LAB — VITAMIN D 25 HYDROXY (VIT D DEFICIENCY, FRACTURES): Vit D, 25-Hydroxy: 23.5 ng/mL — ABNORMAL LOW (ref 30.0–100.0)

## 2022-06-16 NOTE — Progress Notes (Signed)
Complete physical exam  Patient: STERLING MONDO   DOB: 23-Jan-1961   62 y.o. Male  MRN: [id number]  Subjective:    Chief Complaint  Patient presents with   Annual Exam    Non- fasting. Has concerns about skin spots on his face.     UCHENNA RAPPAPORT is a 62 y.o. male who presents today for a complete physical exam. He reports consuming a general diet. Home exercise routine includes yoga and running. He generally feels fairly well. He reports sleeping fairly well. He would like to have PSA testing done again just to be on the safe side.  Apparently has had some slightly abnormal ones in the past.  He is on Wellbutrin which he thinks helps with his ADHD as well as psychologically.  He thinks that it is working but is unsure as to whether he should be feeling better.  He does not like to take it at night because it interferes with sleep.  He does not use Adderall as it had adverse effects.  He is no longer taking metoprolol.  He does see GI on a regular basis.  He also has a previous history of a B12 deficiency and also low vitamin D.  He does have a right inguinal hernia and has yet to have this operated on.  Is not really causing him necessarily any concerns.  His work and home life are going well.   Most recent fall risk assessment:    06/16/2022    1:44 PM  [place] in the past year? 0  Number falls in past yr: 0  Injury with Fall? 0  Risk for fall due to : No Fall Risks  Follow up Falls evaluation completed     Most recent depression screenings:    06/16/2022    1:43 PM 01/06/2022    8:12 AM  PHQ 2/9 Scores  PHQ - 2 Score 3 0  PHQ- 9 Score 3 4    Vision:Not within last year  and Dental: Receives regular dental care     Patient Care Team: [name], MD as PCP - General (Family Medicine)   Outpatient Medications Prior to Visit  Medication Sig   albuterol (VENTOLIN HFA) 108 (90 Base) MCG/ACT inhaler TAKE 2 PUFFS BY MOUTH EVERY 6 HOURS AS NEEDED FOR WHEEZE OR  SHORTNESS OF BREATH   buPROPion (WELLBUTRIN SR) 200 MG 12 hr tablet Take 200 mg by mouth daily.   EPINEPHrine (EPIPEN 2-PAK) 0.3 mg/0.3 mL IJ SOAJ injection Inject 0.3 mLs (0.3 mg total) into the muscle as needed for anaphylaxis.   omeprazole (PRILOSEC OTC) 20 MG tablet Take 20 mg by mouth as needed. Using about 3 days a week.   amphetamine-dextroamphetamine (ADDERALL) 30 MG tablet Take 1 tablet by mouth daily.   aspirin EC 81 MG tablet Take 1 tablet (81 mg total) by mouth daily. Swallow whole.   [DISCONTINUED] Acetaminophen (TYLENOL PO) Take by mouth. With caffeine for headache (Patient not taking: Reported on 01/06/2022)   [DISCONTINUED] metoprolol tartrate (LOPRESSOR) 100 MG tablet Take one tablet by mouth 2 hours prior to CT (Patient not taking: Reported on 06/12/2021)   No facility-administered medications prior to visit.    Review of Systems  All other systems reviewed and are negative.         Objective:     BP 102/70   Pulse 73   Temp 97.9 F (36.6 C) (Oral)   Resp 14   Ht  5' 9.5" (1.765 m)   Wt 157 lb 9.6 oz (71.5 kg)   SpO2 95% Comment: room air  BMI 22.94 kg/m     Physical Exam  Alert and in no distress. Tympanic membranes and canals are normal. Pharyngeal area is normal. Neck is supple without adenopathy or thyromegaly. Cardiac exam shows a regular sinus rhythm without murmurs or gallops. Lungs are clear to auscultation.  Skin exam shows no particular lesions.  Results for orders placed or performed in visit on 06/16/22  POCT Urinalysis DIP (Proadvantage Device)  Result Value Ref Range   Color, UA yellow yellow   Clarity, UA clear clear   Glucose, UA negative negative mg/dL   Bilirubin, UA negative negative   Ketones, POC UA negative negative mg/dL   Specific Gravity, Urine 1.025    Blood, UA negative negative   pH, UA 6.0 5.0 - 8.0   Protein Ur, POC negative negative mg/dL   Urobilinogen, Ur 0.2    Nitrite, UA Negative Negative   Leukocytes, UA Negative  Negative        Assessment & Plan:   Annual physical exam - Plan: POCT Urinalysis DIP (Proadvantage Device)  Seasonal allergic rhinitis due to pollen  Crohn's disease of small intestine without complication (HCC)  Gastroesophageal reflux disease, unspecified whether esophagitis present  Arthritis  Attention deficit hyperactivity disorder (ADHD), combined type  B12 deficiency  Dysthymia  Family history of ischemic heart disease  Vitamin B12 deficiency  Need for shingles vaccine - Plan: Zoster Recombinant (Shingrix ), CANCELED: Varicella-zoster vaccine IM  Vitamin D insufficiency  Immunization History  Administered Date(s) Administered   DTaP 04/02/1999   Influenza Split 02/19/2012   Influenza,inj,Quad PF,6+ Mos 04/18/2014, 03/10/2016, 04/28/2017, 03/07/2018, 03/06/2019, 06/12/2021   Influenza-Unspecified 02/16/2022   Moderna SARS-COV2 Booster Vaccination 02/19/2022   PFIZER Comirnaty(Gray Top)Covid-19 Tri-Sucrose Vaccine 12/03/2020   PFIZER(Purple Top)SARS-COV-2 Vaccination 08/25/2019, 09/15/2019, 04/18/2020   Pfizer Covid-19 Vaccine Bivalent Booster 33yrs & up 06/12/2021   Tdap 06/24/2009, 06/27/2016   Unspecified SARS-COV-2 Vaccination 02/19/2022   Zoster Recombinat (Shingrix) 01/06/2022, 06/16/2022    Health Maintenance  Topic Date Due   HIV Screening  Never done   COVID-19 Vaccine (7 - 2023-24 season) 04/16/2022   COLONOSCOPY (Pts 45-63yrs Insurance coverage will need to be confirmed)  07/02/2024   DTaP/Tdap/Td (4 - Td or Tdap) 06/27/2026   INFLUENZA VACCINE  Completed   Hepatitis C Screening  Completed   Zoster Vaccines- Shingrix  Completed   HPV VACCINES  Aged Out    Discussed health benefits of physical activity, and encouraged him to engage in regular exercise appropriate for his age and condition.  Problem List Items Addressed This Visit     Allergic rhinitis, seasonal - Primary   Arthritis   Attention deficit hyperactivity disorder (ADHD),  combined type   B12 deficiency   Crohn's disease of small intestine without complication ([place])   Dysthymia   Family history of ischemic heart disease   Gastroesophageal reflux disease   Other Visit Diagnoses     Vitamin B12 deficiency       Need for shingles vaccine       Relevant Orders   Zoster Recombinant (Shingrix ) (Completed)   Annual physical exam       Relevant Orders   POCT Urinalysis DIP (Proadvantage Device) (Completed)      Continue on B12 and increase vitamin D.  Follow-up with GI.  His elevated triglycerides were probably related to eating earlier. Recommend he increase his Wellbutrin to  400 mg but take it in the morning the next month and let me know how it works. PSA and percent PSA ordered. Discussed the hernia and since he is not having any difficulty, no problem with waiting on this.  Explained that this is an elective procedure.    Sharlot Gowda, MD

## 2022-06-17 LAB — SPECIMEN STATUS REPORT

## 2022-06-17 LAB — PSA: Prostate Specific Ag, Serum: 1 ng/mL (ref 0.0–4.0)

## 2023-07-27 ENCOUNTER — Encounter: Payer: Self-pay | Admitting: Internal Medicine

## 2023-11-04 ENCOUNTER — Encounter: Payer: Self-pay | Admitting: Family Medicine

## 2024-01-14 ENCOUNTER — Telehealth: Payer: Self-pay | Admitting: Family Medicine

## 2024-01-14 DIAGNOSIS — Z0000 Encounter for general adult medical examination without abnormal findings: Secondary | ICD-10-CM

## 2024-01-14 NOTE — Telephone Encounter (Signed)
 Copied from CRM #[id number]. Topic: Appointments - Scheduling Inquiry for Clinic >> Jan 14, 2024 11:25 AM Cory [name] ORN wrote: Reason for CRM: Patient called. Wanted to schedule physical and appt with new sports medicine intern for foot as recommenced by PCP. Scheduled physical 8/20. Sending CRM for sports medicine. Patient states he usually has order put in to have his labs done at outside [facility]. Can go to either [place] or [facility] across from hospital. Would like to request order be put in so he can go Monday if possible.    ----------------------------------------------------------------------- From previous Reason for Contact - Scheduling: Patient/patient representative is calling to schedule an appointment. Refer to attachments for appointment information.  Do you want patient to have fasting labs prior to physical appointment on 8/20?

## 2024-01-17 NOTE — Telephone Encounter (Signed)
 Placed ordered and left them in the front office for patient to come pick up.

## 2024-01-18 LAB — CBC WITH DIFFERENTIAL/PLATELET

## 2024-01-19 ENCOUNTER — Encounter: Payer: Self-pay | Admitting: Family Medicine

## 2024-01-19 ENCOUNTER — Ambulatory Visit: Admitting: Family Medicine

## 2024-01-19 VITALS — BP 118/74 | HR 63 | Ht 70.0 in | Wt 160.2 lb

## 2024-01-19 DIAGNOSIS — Z91030 Bee allergy status: Secondary | ICD-10-CM

## 2024-01-19 DIAGNOSIS — Z0000 Encounter for general adult medical examination without abnormal findings: Secondary | ICD-10-CM

## 2024-01-19 DIAGNOSIS — M19079 Primary osteoarthritis, unspecified ankle and foot: Secondary | ICD-10-CM | POA: Diagnosis not present

## 2024-01-19 DIAGNOSIS — F338 Other recurrent depressive disorders: Secondary | ICD-10-CM

## 2024-01-19 DIAGNOSIS — F902 Attention-deficit hyperactivity disorder, combined type: Secondary | ICD-10-CM | POA: Diagnosis not present

## 2024-01-19 DIAGNOSIS — G4733 Obstructive sleep apnea (adult) (pediatric): Secondary | ICD-10-CM | POA: Diagnosis not present

## 2024-01-19 DIAGNOSIS — K4090 Unilateral inguinal hernia, without obstruction or gangrene, not specified as recurrent: Secondary | ICD-10-CM

## 2024-01-19 LAB — COMPREHENSIVE METABOLIC PANEL WITH GFR
ALT: 17 IU/L (ref 0–44)
AST: 29 IU/L (ref 0–40)
Albumin: 4.4 g/dL (ref 3.9–4.9)
Alkaline Phosphatase: 82 IU/L (ref 44–121)
BUN/Creatinine Ratio: 12 (ref 10–24)
BUN: 14 mg/dL (ref 8–27)
Bilirubin Total: 2.2 mg/dL — ABNORMAL HIGH (ref 0.0–1.2)
CO2: 24 mmol/L (ref 20–29)
Calcium: 9.4 mg/dL (ref 8.6–10.2)
Chloride: 105 mmol/L (ref 96–106)
Creatinine, Ser: 1.13 mg/dL (ref 0.76–1.27)
Globulin, Total: 2.2 g/dL (ref 1.5–4.5)
Glucose: 83 mg/dL (ref 70–99)
Potassium: 4.5 mmol/L (ref 3.5–5.2)
Sodium: 142 mmol/L (ref 134–144)
Total Protein: 6.6 g/dL (ref 6.0–8.5)
eGFR: 73 mL/min/1.73 (ref >59)

## 2024-01-19 LAB — CBC WITH DIFFERENTIAL/PLATELET
Basos: 1
EOS (ABSOLUTE): 0.1 x10E3/uL (ref 0.0–0.2)
Eos: 3
Hematocrit: 41.4 (ref 37.5–51.0)
Hemoglobin: 13.7 g/dL (ref 13.0–17.7)
Immature Granulocytes: 0
Immature Granulocytes: 0 x10E3/uL (ref 0.0–0.1)
Lymphs: 33
MCH: 31.9 pg (ref 26.6–33.0)
MCHC: 33.1 g/dL (ref 31.5–35.7)
MCV: 96 fL (ref 79–97)
Monocytes Absolute: 0.2 x10E3/uL (ref 0.0–0.4)
Monocytes Absolute: 0.7 x10E3/uL (ref 0.1–0.9)
Monocytes: 12
Neutrophils Absolute: 1.8 x10E3/uL (ref 0.7–3.1)
Neutrophils Absolute: 2.9 x10E3/uL (ref 1.4–7.0)
Neutrophils: 51
Platelets: 193 x10E3/uL (ref 150–450)
RBC: 4.3 x10E6/uL (ref 4.14–5.80)
RDW: 12.2 (ref 11.6–15.4)
WBC: 5.6 x10E3/uL (ref 3.4–10.8)

## 2024-01-19 LAB — LIPID PANEL
Chol/HDL Ratio: 2.4 ratio (ref 0.0–5.0)
Cholesterol, Total: 161 mg/dL (ref 100–199)
HDL: 67 mg/dL (ref >39)
LDL Chol Calc (NIH): 77 mg/dL (ref 0–99)
Triglycerides: 90 mg/dL (ref 0–149)
VLDL Cholesterol Cal: 17 mg/dL (ref 5–40)

## 2024-01-19 LAB — PSA: Prostate Specific Ag, Serum: 0.6 ng/mL (ref 0.0–4.0)

## 2024-01-19 MED ORDER — BUPROPION HCL ER (SR) 200 MG PO TB12: 200.0000 mg | ORAL_TABLET | Freq: Every day | ORAL | 1 refills | Status: AC

## 2024-01-19 MED ORDER — EPINEPHRINE 0.3 MG/0.3ML IJ SOAJ: 0.3000 mg | INTRAMUSCULAR | 0 refills | Status: AC | PRN
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
## 2024-01-20 LAB — HIV ANTIBODY (ROUTINE TESTING W REFLEX): HIV Screen 4th Generation wRfx: NONREACTIVE

## 2024-01-24 ENCOUNTER — Ambulatory Visit (INDEPENDENT_AMBULATORY_CARE_PROVIDER_SITE_OTHER): Admitting: Family Medicine

## 2024-01-24 ENCOUNTER — Encounter: Payer: Self-pay | Admitting: Family Medicine

## 2024-01-24 VITALS — BP 110/72 | Ht 70.5 in | Wt 160.0 lb

## 2024-01-24 DIAGNOSIS — G8929 Other chronic pain: Secondary | ICD-10-CM | POA: Diagnosis not present

## 2024-01-24 DIAGNOSIS — M79672 Pain in left foot: Secondary | ICD-10-CM

## 2024-01-24 DIAGNOSIS — M2022 Hallux rigidus, left foot: Secondary | ICD-10-CM | POA: Insufficient documentation

## 2024-01-24 DIAGNOSIS — M19079 Primary osteoarthritis, unspecified ankle and foot: Secondary | ICD-10-CM | POA: Diagnosis not present

## 2024-01-24 NOTE — Patient Instructions (Signed)
 Thank you for coming to see me today. It was a pleasure.   We made you new orthotics. Let us know if we need to add any extra cushioning or make changes.  If you have any questions or concerns, please do not hesitate to call the office at [phone].

## 2024-01-24 NOTE — Progress Notes (Signed)
 DATE OF VISIT: 01/24/2024        Cory [name] DOB: 1960/06/24 MRN: [id number]  CC:  Custom orthotics  History of present Illness: Cory [name] is a 63 y.o. male who presents for custom orthotics Referred by Dr Reyne Bustle 3 of chronic foot pain with known left first MTP osteoarthritis Has had previous temporary orthotics from us , last made 06/10/2021 with associated first ray post - He no longer has these He had a wants to increase his walking and would like to get back to running  Medications:  Outpatient Encounter Medications as of 01/24/2024  Medication Sig   albuterol  (VENTOLIN  HFA) 108 (90 Base) MCG/ACT inhaler TAKE 2 PUFFS BY MOUTH EVERY 6 HOURS AS NEEDED FOR WHEEZE OR SHORTNESS OF BREATH   buPROPion  (WELLBUTRIN  SR) 200 MG 12 hr tablet Take 1 tablet (200 mg total) by mouth daily.   EPINEPHrine  (EPIPEN  2-PAK) 0.3 mg/0.3 mL IJ SOAJ injection Inject 0.3 mg into the muscle as needed for anaphylaxis.   omeprazole (PRILOSEC OTC) 20 MG tablet Take 20 mg by mouth as needed. Using about 3 days a week.   No facility-administered encounter medications on file as of 01/24/2024.    Allergies: is allergic to bee pollen, bee venom, and penicillins.  Physical Examination: Vitals: BP 110/72   Ht 5' 10.5 (1.791 m)   Wt 160 lb (72.6 kg)   BMI 22.63 kg/m  GENERAL:  Cory [name] is a 63 y.o. male appearing their stated age, alert and oriented x 3, in no apparent distress.  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  Prominence of the left first MTP joint, no increased redness or warmth.  Has associated hallux rigidus in this area.  No significant transverse arch collapse.  No specific bony tenderness Gait: Slight hyperpronation of both feet bilaterally N/V/I distally Assessment & Plan  1. Chronic pain in left foot 2. Arthritis of metatarsophalangeal (MTP) joint of great toe 3. Hallux rigidus of left  foot Chronic left foot pain with great toe MTP osteoarthritis and associated hallux rigidus.  Has done well with previous temporary orthotics  Plan: - Custom orthotics fabricated in the office as noted below with customized first ray post.  These were comfortable in the office today - She did inquire about possible orthotics for his dress shoes.  He may consider returning for a lower profile custom orthotic with first ray post for his dress shoes in the future - He can follow-up with us  on an as-needed basis  Patient was fitted for a : Fit 'n Run semi-rigid orthotic  The orthotic was heated, placed on the orthotic stand. The patient was positioned in subtalar neutral position and 10 degrees of ankle dorsiflexion in a weight bearing stance on the heated orthotic blank After completion of molding Blank: Fit 'n Run - size 11 Posting:  1st ray post with blue poron on the left Base: none - built-in Fit and Run base  Without expert comfortable in the office today and he had a more neutral gait  Patient expressed understanding & agreement with above.  Encounter Diagnoses  Name Primary?   Chronic pain in left foot Yes   Arthritis of metatarsophalangeal (MTP) joint of great toe    Hallux rigidus of left foot     No orders of the defined types were placed in this encounter.

## 2024-04-07 ENCOUNTER — Ambulatory Visit: Payer: Self-pay

## 2024-04-07 ENCOUNTER — Encounter: Payer: Self-pay | Admitting: Family Medicine

## 2024-04-07 ENCOUNTER — Ambulatory Visit: Admitting: Family Medicine

## 2024-04-07 VITALS — BP 120/70 | HR 80 | Temp 98.3°F | Wt 166.2 lb

## 2024-04-07 DIAGNOSIS — R059 Cough, unspecified: Secondary | ICD-10-CM

## 2024-04-07 DIAGNOSIS — J069 Acute upper respiratory infection, unspecified: Secondary | ICD-10-CM

## 2024-04-07 LAB — POCT INFLUENZA A/B
Influenza A, POC: NEGATIVE
Influenza B, POC: NEGATIVE

## 2024-04-07 LAB — POC COVID19 BINAXNOW: SARS Coronavirus 2 Ag: NEGATIVE

## 2024-04-07 MED ORDER — METHYLPREDNISOLONE 4 MG PO TBPK
ORAL_TABLET | ORAL | 0 refills | Status: AC
Start: 2024-04-07 — End: ?

## 2024-04-07 MED ORDER — FLUTICASONE PROPIONATE 50 MCG/ACT NA SUSP: 2.0000 | Freq: Every day | NASAL | 6 refills | Status: AC

## 2024-04-07 MED ORDER — AZITHROMYCIN 250 MG PO TABS: ORAL_TABLET | ORAL | 0 refills | Status: AC

## 2024-04-07 NOTE — Telephone Encounter (Signed)
 FYI Only or Action Required?: Action required by provider: request for appointment.  Patient was last seen in primary care on 01/19/2024 by Vita Morrow, MD.  Called Nurse Triage reporting No chief complaint on file..  Symptoms began about a month ago.  Interventions attempted: OTC medications: Mucinex.  Symptoms are: gradually worsening.  Triage Disposition: No disposition on file.  Patient/caregiver understands and will follow disposition?:    Copied from CRM #[id number]. Topic: Clinical - Red Word Triage >> Apr 07, 2024  7:46 AM Deaijah H wrote: Red Word that prompted transfer to Nurse Triage: Chest infection for about a month. Congestion in chest. Low grade fever. Mucus w/ color some days (dark green). Gotten worse last 3-4 days. Reason for Disposition  [1] MILD difficulty breathing (e.g., minimal/no SOB at rest, SOB with walking, pulse < 100) AND [2] still present when not coughing  Answer Assessment - Initial Assessment Questions 1. ONSET: When did the cough begin?      Worsening over the past three days, began a month ago  2. SEVERITY: How bad is the cough today?      Constant  3. SPUTUM: Describe the color of your sputum (e.g., none, dry cough; clear, white, yellow, green)     Yes, Dark Green  4. HEMOPTYSIS: Are you coughing up any blood? If Yes, ask: How much? (e.g., flecks, streaks, tablespoons, etc.)     No  5. DIFFICULTY BREATHING: Are you having difficulty breathing? If Yes, ask: How bad is it? (e.g., mild, moderate, severe)      Mild  6. FEVER: Do you have a fever? If Yes, ask: What is your temperature, how was it measured, and when did it start?     Unsure, unmeasured, feels like I had a low grade fever  7. CARDIAC HISTORY: Do you have any history of heart disease? (e.g., heart attack, congestive heart failure)      No  8. LUNG HISTORY: Do you have any history of lung disease?  (e.g., pulmonary embolus, asthma, emphysema)     No  9. PE  RISK FACTORS: Do you have a history of blood clots? (or: recent major surgery, recent prolonged travel, bedridden)     No  10. OTHER SYMPTOMS: Do you have any other symptoms? (e.g., runny nose, wheezing, chest pain)       Congestion, Headache, Muscle Aches  12. TRAVEL: Have you traveled out of the country in the last month? (e.g., travel history, exposures)       Wife had Covid previously, the patient has tested over five times and has been negative every time.  Protocols used: Cough - Acute Productive-A-AH

## 2024-04-07 NOTE — Progress Notes (Signed)
 Name: Cory [name]   Date of Visit: 04/07/24   Date of last visit with me: 01/19/2024   CHIEF COMPLAINT:  Chief Complaint  Patient presents with   Acute Visit    Cough and congestion, went to [place]  in Sept. Felt really bad after that chest never cleared up, cough came back now, low grade fever, taking dayquil and Nyquil,        HPI:  Discussed the use of AI scribe software for clinical note transcription with the patient, who gave verbal consent to proceed.  History of Present Illness   Cory [name] is a 63 year old male who presents with persistent chest congestion and cough.  He has experienced chest congestion since returning from a trip to [place]  in September. Initially, both he and his wife were ill after the trip, with his wife testing positive for COVID-19, although he never tested positive for COVID-19 or flu. His symptoms seemed to improve after three to four weeks, but the chest congestion persisted.  Over the last three days, his symptoms have worsened, with a return of the cough and a sensation of not being able to 'catch his breath.' He describes coughing up 'big, green, dark chunks' last week, which have since turned clear, but the congestion has worsened. He has experienced low-grade fevers. No nasal congestion, but he mentions a runny nose.  He has a history of exercise-induced asthma and occasionally uses an inhaler when running, although he has not been running recently due to his chest symptoms. He has been using orthotics, which have helped alleviate knee pain, although he still experiences some toe pain. He has not started running again due to his ongoing chest congestion.         OBJECTIVE:       01/19/2024   10:28 AM  Depression screen PHQ 2/9  Decreased Interest 0  Down, Depressed, Hopeless 0  PHQ - 2 Score 0     BP Readings from Last 3 Encounters:  04/07/24 120/70  01/24/24 110/72  01/19/24 118/74    BP 120/70   Pulse 80   Temp  98.3 F (36.8 C)   Wt 166 lb 3.2 oz (75.4 kg)   SpO2 95%   BMI 23.51 kg/m    Physical Exam   CHEST: Wheezing on cough.      Physical Exam Constitutional:      Appearance: Normal appearance.  Cardiovascular:     Rate and Rhythm: Normal rate and regular rhythm.     Pulses: Normal pulses.     Heart sounds: Normal heart sounds.  Pulmonary:     Effort: Pulmonary effort is normal. No respiratory distress.     Breath sounds: No stridor. Wheezing present. No rhonchi.  Neurological:     General: No focal deficit present.     Mental Status: He is alert and oriented to person, place, and time. Mental status is at baseline.     ASSESSMENT/PLAN:   Assessment & Plan Cough, unspecified type  Viral URI with cough    Assessment and Plan    Acute upper respiratory infection with persistent cough and chest congestion Persistent cough and chest congestion with wheezing, likely viral with possible bacterial superinfection. No true pneumonia, possible walking pneumonia. - Prescribed steroid pack for wheezing and chest congestion. - Prescribed Z-Pak for potential bacterial superinfection. - Recommended Flonase for nasal congestion and postnasal drip. - Advised follow-up if no improvement by next week.  - Flu and Covid negative  Jya Hughston A. Vita MD [facility]

## 2024-04-13 ENCOUNTER — Ambulatory Visit: Payer: Self-pay

## 2024-04-13 DIAGNOSIS — J069 Acute upper respiratory infection, unspecified: Secondary | ICD-10-CM

## 2024-04-13 NOTE — Telephone Encounter (Signed)
 FYI Only or Action Required?: FYI only for provider: FYI.  Patient was last seen in primary care on 04/07/2024 by Cory Morrow, MD.  Called Nurse Triage reporting Cough.  Symptoms began about a month ago.  Interventions attempted: Prescription medications: methylprednisone and z-pak.  Symptoms are: gradually improving.  Triage Disposition: Home Care  Patient/caregiver understands and will follow disposition?: Yes Reason for Disposition  Cough  Answer Assessment - Initial Assessment Questions Prescribed steroid and z-pak last week, patient states some improvement with medication and not chest is not clear, still feeling congested. was advised to let PCP know. Please advise.   1. ONSET: When did the cough begin?      Months  2. SEVERITY: How bad is the cough today?      Mild  3. SPUTUM: Describe the color of your sputum (e.g., none, dry cough; clear, white, yellow, green)     Clear /dark in color   4. DIFFICULTY BREATHING: Are you having difficulty breathing? If Yes, ask: How bad is it? (e.g., mild, moderate, severe)      Denies  5. FEVER: Do you have a fever? If Yes, ask: What is your temperature, how was it measured, and when did it start?     Denies  Protocols used: Cough - Acute Productive-A-AH  Copied from CRM #[id number]. Topic: Clinical - Red Word Triage >> Apr 13, 2024  2:28 PM Cory [name] wrote: Red Word that prompted transfer to Nurse Triage: Patient still has cough and chest congestion after medication. W/ dark green/brown mucus. Was advised to left Dr. Vita know.

## 2024-04-14 ENCOUNTER — Other Ambulatory Visit: Payer: Self-pay | Admitting: Family Medicine

## 2024-04-14 DIAGNOSIS — R059 Cough, unspecified: Secondary | ICD-10-CM

## 2024-04-14 MED ORDER — AMOXICILLIN-POT CLAVULANATE 875-125 MG PO TABS: 1.0000 | ORAL_TABLET | Freq: Two times a day (BID) | ORAL | 0 refills | Status: AC

## 2024-04-14 NOTE — Telephone Encounter (Signed)
 Patient is agreeable to start medication

## 2025-01-24 ENCOUNTER — Encounter: Payer: Self-pay | Admitting: Family Medicine
